# Patient Record
Sex: Female | Born: 1980
Health system: Southern US, Community
[De-identification: ages and names within clinical notes are randomized; demographics above are authoritative.]

## PROBLEM LIST (undated history)

## (undated) DIAGNOSIS — E785 Hyperlipidemia, unspecified: Secondary | ICD-10-CM

## (undated) DIAGNOSIS — Z8632 Personal history of gestational diabetes: Secondary | ICD-10-CM

## (undated) DIAGNOSIS — E119 Type 2 diabetes mellitus without complications: Secondary | ICD-10-CM

## (undated) HISTORY — DX: Hyperlipidemia, unspecified: E78.5

## (undated) HISTORY — PX: BREAST BIOPSY: SHX20

## (undated) HISTORY — PX: TONSILLECTOMY: SUR1361

## (undated) HISTORY — DX: Type 2 diabetes mellitus without complications: E11.9

## (undated) HISTORY — DX: Personal history of gestational diabetes: Z86.32

---

## 1999-02-10 ENCOUNTER — Other Ambulatory Visit: Admission: RE | Admit: 1999-02-10 | Discharge: 1999-02-10 | Payer: Self-pay | Admitting: Obstetrics & Gynecology

## 2002-01-07 ENCOUNTER — Other Ambulatory Visit: Admission: RE | Admit: 2002-01-07 | Discharge: 2002-01-07 | Payer: Self-pay | Admitting: Obstetrics & Gynecology

## 2003-07-14 ENCOUNTER — Other Ambulatory Visit: Admission: RE | Admit: 2003-07-14 | Discharge: 2003-07-14 | Payer: Self-pay | Admitting: Obstetrics & Gynecology

## 2004-11-13 ENCOUNTER — Other Ambulatory Visit: Admission: RE | Admit: 2004-11-13 | Discharge: 2004-11-13 | Payer: Self-pay | Admitting: Obstetrics & Gynecology

## 2005-04-11 ENCOUNTER — Ambulatory Visit: Payer: Self-pay | Admitting: Obstetrics and Gynecology

## 2005-07-05 ENCOUNTER — Observation Stay: Payer: Self-pay | Admitting: Obstetrics and Gynecology

## 2005-07-07 ENCOUNTER — Observation Stay: Payer: Self-pay | Admitting: Obstetrics and Gynecology

## 2005-07-08 ENCOUNTER — Inpatient Hospital Stay: Payer: Self-pay | Admitting: Obstetrics and Gynecology

## 2008-02-21 ENCOUNTER — Other Ambulatory Visit: Payer: Self-pay

## 2008-02-21 ENCOUNTER — Emergency Department: Payer: Self-pay | Admitting: Emergency Medicine

## 2010-06-08 ENCOUNTER — Inpatient Hospital Stay: Payer: Self-pay

## 2011-07-26 ENCOUNTER — Ambulatory Visit: Payer: Self-pay | Admitting: Obstetrics and Gynecology

## 2011-07-30 ENCOUNTER — Observation Stay: Payer: Self-pay

## 2011-08-09 ENCOUNTER — Inpatient Hospital Stay: Payer: Self-pay

## 2012-11-19 ENCOUNTER — Emergency Department: Payer: Self-pay

## 2012-11-19 LAB — LIPASE, BLOOD: Lipase: 273 U/L (ref 73–393)

## 2012-11-19 LAB — URINALYSIS, COMPLETE
Bilirubin,UR: NEGATIVE
Ketone: NEGATIVE
Nitrite: NEGATIVE
Protein: 100
RBC,UR: 569 /HPF (ref 0–5)
Specific Gravity: 1.026 (ref 1.003–1.030)
Squamous Epithelial: 12

## 2012-11-19 LAB — COMPREHENSIVE METABOLIC PANEL
Albumin: 4 g/dL (ref 3.4–5.0)
Anion Gap: 9 (ref 7–16)
BUN: 10 mg/dL (ref 7–18)
Bilirubin,Total: 0.3 mg/dL (ref 0.2–1.0)
Calcium, Total: 9.1 mg/dL (ref 8.5–10.1)
Creatinine: 0.8 mg/dL (ref 0.60–1.30)
EGFR (Non-African Amer.): >60
SGOT(AST): 18 U/L (ref 15–37)
SGPT (ALT): 17 U/L (ref 12–78)
Sodium: 138 mmol/L (ref 136–145)

## 2012-11-19 LAB — CBC
HGB: 13.5 g/dL (ref 12.0–16.0)
RDW: 14.2 % (ref 11.5–14.5)
WBC: 9.8 10*3/uL (ref 3.6–11.0)

## 2012-11-19 LAB — PREGNANCY, URINE: Pregnancy Test, Urine: NEGATIVE m[IU]/mL

## 2013-05-16 ENCOUNTER — Observation Stay: Payer: Self-pay | Admitting: Obstetrics and Gynecology

## 2013-05-16 LAB — URINALYSIS, COMPLETE
Bilirubin,UR: NEGATIVE
Glucose,UR: NEGATIVE mg/dL (ref 0–75)
Ketone: NEGATIVE
Ph: 6 (ref 4.5–8.0)
Specific Gravity: 1.001 (ref 1.003–1.030)
Squamous Epithelial: 2
WBC UR: <1 /HPF (ref 0–5)

## 2013-07-21 ENCOUNTER — Ambulatory Visit: Payer: Self-pay | Admitting: Unknown Physician Specialty

## 2013-08-08 ENCOUNTER — Ambulatory Visit: Payer: Self-pay | Admitting: Unknown Physician Specialty

## 2013-09-04 ENCOUNTER — Inpatient Hospital Stay: Payer: Self-pay | Admitting: Obstetrics and Gynecology

## 2013-09-04 LAB — CBC WITH DIFFERENTIAL/PLATELET
Basophil #: 0 10*3/uL (ref 0.0–0.1)
Eosinophil #: 0.1 10*3/uL (ref 0.0–0.7)
Lymphocyte #: 1.8 10*3/uL (ref 1.0–3.6)
Lymphocyte %: 20.8 %
MCH: 27.5 pg (ref 26.0–34.0)
MCHC: 33.4 g/dL (ref 32.0–36.0)
Monocyte #: 0.6 x10 3/mm (ref 0.2–0.9)
Monocyte %: 7.3 %
Neutrophil #: 6.2 10*3/uL (ref 1.4–6.5)
Neutrophil %: 70.9 %
Platelet: 210 10*3/uL (ref 150–440)
RBC: 4.53 10*6/uL (ref 3.80–5.20)
RDW: 15.2 % — ABNORMAL HIGH (ref 11.5–14.5)

## 2013-09-04 LAB — GLUCOSE, RANDOM: Glucose: 115 mg/dL — ABNORMAL HIGH (ref 65–99)

## 2013-09-05 LAB — HEMATOCRIT: HCT: 34.1 % — ABNORMAL LOW (ref 35.0–47.0)

## 2014-11-11 ENCOUNTER — Ambulatory Visit (INDEPENDENT_AMBULATORY_CARE_PROVIDER_SITE_OTHER): Payer: BLUE CROSS/BLUE SHIELD | Admitting: Podiatry

## 2014-11-11 ENCOUNTER — Encounter: Payer: Self-pay | Admitting: Podiatry

## 2014-11-11 ENCOUNTER — Ambulatory Visit (INDEPENDENT_AMBULATORY_CARE_PROVIDER_SITE_OTHER): Payer: BLUE CROSS/BLUE SHIELD

## 2014-11-11 VITALS — BP 125/85 | HR 94 | Resp 18

## 2014-11-11 DIAGNOSIS — R52 Pain, unspecified: Secondary | ICD-10-CM

## 2014-11-11 DIAGNOSIS — S90122A Contusion of left lesser toe(s) without damage to nail, initial encounter: Secondary | ICD-10-CM

## 2014-11-11 DIAGNOSIS — B351 Tinea unguium: Secondary | ICD-10-CM

## 2014-11-11 NOTE — Progress Notes (Signed)
   Subjective:    Patient ID: Jodi Salazar, female    DOB: 07/31/1981, 34 y.o.   MRN: [id number]  HPI  34 year old female presents the office today with complaints of thick, painful, elongated toenails of the left big toe. She states this been ongoing for several years. She states that she's had no prior treatment and she is requesting the toenail to be removed. She denies any recent redness or drainage on the nail site. She also states that she fell last week injuring her fourth toe for which she has had some bruising. She states this is not the reason for the appointment however she wanted to bring into our attention. She states that after the fall she had some discomfort to the fourth toe on the left foot however over the last couple days she has not had any discomfort. No other complaints at this time.    Review of Systems  All other systems reviewed and are negative.      Objective:   Physical Exam AAO 3, NAD DP/PT pulses palpable, CRT less than 3 seconds Protective sensation intact with Simms Weinstein monofilament, vibratory sensation intact, Achilles tendon reflex intact. Left hallux nail is significantly hypertrophic, dystrophic, discolored, brittle. The right hallux as well as bilateral fifth digit nails are also hypertrophic, dystrophic, discolored. There is no swelling erythema or drainage from the nail sites. No open lesions or pre-ulcerative lesions are identified. There is mild ecchymosis along the dorsal aspect of the left fourth digit just proximal to the nail border. There is no tenderness palpation overlying the digits and there is no pain with range of motion. There is no amount edema, erythema, increase in warmth. MMT 5/5, ROM WNL No past calf compression, swelling, warmth, erythema.       Assessment & Plan:  34 year old female with symptomatic left hallux onychomycosis, onychomycosis bilateral hallux/fifth digit nails; left fourth digit contusion -Treatment options  both conservative and surgical were discussed with the patient going alternatives, risks, complications. -At this time the patient is requesting removal of the left hallux toenail. Risks and applications of this were discussed for which she understands and verbally consents. Under sterile conditions a total of 2.5 mL of a mixture of 2% lidocaine plain and 0.5% Marcaine plain was infiltrated in a hallux block fashion to the left side after the site was properly identified. Once anesthetized the skin was then prepped in sterile fashion. A tourniquet was then applied. Next the left hallux nail was then removed in total with surgery remove all within the nail borders. Once the nail was removed underlying skin was debrided and was found to be intact. There is no purulence or clinical signs of infection. The area was irrigated. Silvadene was applied followed by dry sterile dressing. After application of the dressing the tourniquet was removed and there was found to be an immediate capillary refill time. Patient tolerated the procedure well any complications. Post procedure instructions were discussed the patient for which she verbally understood. [facility] onych[facility]osis. Discussed. Treatment options for onychomycosis however we'll await the results of the biopsy before proceeding treatment. -In regards the left fourth toe recommended buddy splinting. Continue to monitor. -Follow in 1 week for nail check or sooner should any problems are to arise. In the meantime, encouraged to call the office with any questions, concerns, change in symptoms.

## 2014-11-11 NOTE — Patient Instructions (Signed)

## 2014-11-15 ENCOUNTER — Encounter: Payer: Self-pay | Admitting: Podiatry

## 2014-11-16 ENCOUNTER — Telehealth: Payer: Self-pay | Admitting: *Deleted

## 2014-11-16 NOTE — Telephone Encounter (Signed)
Error. Jodi Salazar 

## 2014-11-23 ENCOUNTER — Ambulatory Visit: Payer: BLUE CROSS/BLUE SHIELD | Admitting: Podiatry

## 2014-11-25 ENCOUNTER — Ambulatory Visit (INDEPENDENT_AMBULATORY_CARE_PROVIDER_SITE_OTHER): Payer: BLUE CROSS/BLUE SHIELD | Admitting: Podiatry

## 2014-11-25 ENCOUNTER — Encounter: Payer: Self-pay | Admitting: Podiatry

## 2014-11-25 VITALS — BP 141/116 | HR 91 | Resp 18

## 2014-11-25 DIAGNOSIS — B351 Tinea unguium: Secondary | ICD-10-CM

## 2014-11-25 DIAGNOSIS — R52 Pain, unspecified: Secondary | ICD-10-CM

## 2014-11-25 NOTE — Patient Instructions (Signed)

## 2014-11-28 NOTE — Progress Notes (Signed)
Patient ID: Jodi Salazar, female   DOB: 08/07/1981, 34 y.o.   MRN: [id number]  Subjective: 34 year old phenol returns the office today for follow-up evaluation status post left hallux nail excision. She states that she has some tenderness and soreness to the area at times particularly the pressure however she states it is improving. She denies any drainage or purulence from around the area. She denies any surrounding erythema or any red streaking. Denies any systemic complaints as fevers, chills, nausea, vomiting. No other complaints at this time in no acute changes since last appointment.  Objective: AAO 3, NAD DP/PT pulses palpable, CRT less than 3 seconds Protective sensation intact with Simms Weinstein monofilament, vibratory sensation intact, Achilles tendon reflex intact. Left hallux status post total nail avulsion which is healing well but this timeframe. There is no purulence or drainage identified. There is no swelling erythema or ascending cellulitis. No areas of partial or crepitus. No malodor. There is mild tenderness to palpation of the procedure site. No other areas of tenderness to palpation of bilateral lower extremity's. No other open lesions or pre-ulcerative lesions are identified. No pain with calf compression, swelling, warmth, erythema.  Assessment: 34 year old female 1 week status post left hallux toenail removal  Plan -Treatment options discussed including alternatives, risks, complications. -At this time recommended to continue soaking in Epson salt soaks twice a day covering with antibiotic ointment and a Band-Aid. Can leave the area uncovered at night. Continue this until the area has completely healed. Monitor for any signs or symptoms of infection and directed to call the office immediately should any occur or go to the ER. -Will call the patient once the results of the biopsy are obtained. -Follow-up in 2 weeks if the area has not completely healed or symptomatic.  Follow-up sooner if there are any changes. In the meantime, course and call the office with any questions, concerns, change in symptoms.

## 2014-12-03 ENCOUNTER — Encounter: Payer: Self-pay | Admitting: Podiatry

## 2014-12-07 ENCOUNTER — Ambulatory Visit (INDEPENDENT_AMBULATORY_CARE_PROVIDER_SITE_OTHER): Payer: BLUE CROSS/BLUE SHIELD | Admitting: Podiatry

## 2014-12-07 DIAGNOSIS — B351 Tinea unguium: Secondary | ICD-10-CM

## 2014-12-07 MED ORDER — TAVABOROLE 5 % EX SOLN: 1.0000 [drp] | CUTANEOUS | Status: DC

## 2014-12-07 NOTE — Progress Notes (Signed)
Patient ID: Jodi Salazar, female   DOB: 12/04/1980, 34 y.o.   MRN: [id number]  Subjective: Patient returns to the office today to discuss lab results. Since last appointment, she states that she has had no problems with the left hallux s/p nail removal. Denies any redness/driange/pain. No other complaints at this time.   Objective: AAO x3, NAD Neurovascular status unchanged Left hallux nail procedure site is healed with a small scab overlying the area. There is no tenderness to palpation of the area, drainage/purulence, erythema, malodor, or other clinical signs of infection.  Nails are dystrophic, discolored, hypertrophic. There is no erythema or drainage around the remaining nails. No tenderness along the nail sites.  No areas of tenderness to bilateral lower extremities. No overlying edema, erythema increase in warmth.  No pain with calf compression, swelling, warmth, erythema No open lesions/pre-ulcerative lesions bilaterally.   Assessment: 34 year old female presents to discuss nail biopsy results  Plan: Nail biopsy results were discussed with the patient which revealed onychomycosis. Discussed with her various treatment options. I recommended lamisil based on the culture. She has elected to proceed with topical treatment due to potential side effects. A prescription for kerydin was sent to the pharmacy and she was given contact information to follow-up with them. Discussed side effects of the mediation and how to apply the medication. Follow-up as needed or sooner if any problems are to arise.

## 2015-02-15 NOTE — H&P (Signed)
L&D Evaluation:  History:  HPI 34 year old G4 P3003 with EDC=09/25/2013 by a 5wk 6 day ultrasound at presents at 37 weeks with onset of labor at 0600 this AM followed by bloody show at 0630. [facility]. Her prenatal course remarkable for GDM controlled on diet however last EFW on  11/3 was 6# 10 oz (3 1/2 weeks ago). PNC also remarkable for depression (was on 100 mgm Zoloft daily at one pt in her pregnancy), RH negative (received Rhogam at 28 weeks), and obesity (BMI>34 at start of care). Received TDAP on 07/20/13. LABS: A neg, RI, VI, GBS negative. Will be bottle feeding.   Presents with contractions, vaginal bleeding   Patient's Medical History GDM, depression   Patient's Surgical History Tonsilectomy   Medications Pre Natal Vitamins   Allergies NKDA   Social History none   Family History Non-Contributory   ROS:  ROS see HPI   Exam:  Vital Signs stable   Urine Protein not completed   General breathing with contractions   Mental Status clear   Chest clear   Heart normal sinus rhythm, no murmur/gallop/rubs   Abdomen gravid, tender with contractions   Estimated Fetal Weight Average for gestational age, 8#   Fetal Position cephalic   Edema trace edema, V V lower extremities   Pelvic 7/80%/0 per RN exam   Mebranes Intact   FHT normal rate with no decels, 135 with accels to 150   FHT Description Cat 1   Ucx regular, q3-4 min   Skin dry   Impression:  Impression IUP at 37 weeks in active labor. GDM controlled on diet.   Plan:  Plan EFM/NST, monitor contractions and for cervical change, Expectant management for now. Blood sugar with admission labs was 115.   Electronic Signatures: Trinna BalloonGutierrez, Aja Bolander L (CNM)  (Signed [phone]-Nov-14 12:52)  Authored: L&D Evaluation   Last Updated: 28-Nov-14 12:52 by Trinna BalloonGutierrez, Sera Hitsman L (CNM)

## 2016-02-08 DIAGNOSIS — E1169 Type 2 diabetes mellitus with other specified complication: Secondary | ICD-10-CM | POA: Insufficient documentation

## 2016-02-08 DIAGNOSIS — E7800 Pure hypercholesterolemia, unspecified: Secondary | ICD-10-CM | POA: Insufficient documentation

## 2016-02-08 DIAGNOSIS — E785 Hyperlipidemia, unspecified: Secondary | ICD-10-CM | POA: Insufficient documentation

## 2016-02-23 ENCOUNTER — Encounter: Payer: Self-pay | Admitting: *Deleted

## 2016-02-23 ENCOUNTER — Encounter: Payer: BLUE CROSS/BLUE SHIELD | Attending: Family Medicine | Admitting: *Deleted

## 2016-02-23 VITALS — BP 104/70 | Ht 70.0 in | Wt 231.0 lb

## 2016-02-23 DIAGNOSIS — E119 Type 2 diabetes mellitus without complications: Secondary | ICD-10-CM | POA: Insufficient documentation

## 2016-02-23 NOTE — Progress Notes (Signed)
Diabetes Self-Management Education  Visit Type: First/Initial  Appt. Start Time: 1340 Appt. End Time: 1435  02/23/2016  [name], identified by name and date of birth, is a 35 y.o. female with a diagnosis of Diabetes: Type 2.   ASSESSMENT  Blood pressure 104/70, height 5\' 10"  (1.778 m), weight 231 lb (104.781 kg), last menstrual period 02/12/2016. Body mass index is 33.15 kg/(m^2).      Diabetes Self-Management Education - 02/23/16 1456    Visit Information   Visit Type First/Initial   Initial Visit   Diabetes Type Type 2   Are you currently following a meal plan? Yes   What type of meal plan do you follow? "cut carbs out and increase water intake" also stopped drinking sugar sweetened tea   Are you taking your medications as prescribed? Yes   Date Diagnosed Pt reports 02/08/16; A1C indicative of diabetes on 11/17/15   Health Coping   How would you rate your overall health? Good   Psychosocial Assessment   Patient Belief/Attitude about Diabetes Motivated to manage diabetes   Self-care barriers None   Self-management support Doctor's office;Family   Patient Concerns Nutrition/Meal planning;Medication;Glycemic Control;Weight Control;Healthy Lifestyle;Problem Solving   Special Needs None   Preferred Learning Style Auditory;Visual;Hands on   Learning Readiness Change in progress   How often do you need to have someone help you when you read instructions, pamphlets, or other written materials from your doctor or pharmacy? 1 - Never   What is the last grade level you completed in school? some college   Pre-Education Assessment   Patient understands the diabetes disease and treatment process. Needs Review   Patient understands incorporating nutritional management into lifestyle. Needs Instruction   Patient undertands incorporating physical activity into lifestyle. Needs Review   Patient understands using medications safely. Needs Instruction   Patient understands monitoring  blood glucose, interpreting and using results Needs Review   Patient understands prevention, detection, and treatment of acute complications. Needs Instruction   Patient understands prevention, detection, and treatment of chronic complications. Needs Review   Patient understands how to develop strategies to address psychosocial issues. Needs Instruction   Patient understands how to develop strategies to promote health/change behavior. Needs Review   Complications   Last HgB A1C per patient/outside source 8.9 %  11/17/15   How often do you check your blood sugar? 1-2 times/day   Fasting Blood glucose range (mg/dL) [phone]  Pt reports FBG's range from 140-145 mg/dL.    Have you had a dilated eye exam in the past 12 months? Yes   Have you had a dental exam in the past 12 months? Yes   Are you checking your feet? Yes   How many days per week are you checking your feet? 7   Dietary Intake   Breakfast eggs and 2 strips of bacon; yogurt with 1/2 banana    Snack (morning) cheese stick and yogurt   Lunch tuna, crackers, fruit , vegetables, salada   Snack (afternoon) graham crackers with peanut butter   Dinner chicken, vegetables   Beverage(s) water   Exercise   Exercise Type Light (walking / raking leaves)   How many days per week to you exercise? 5   How many minutes per day do you exercise? 60   Total minutes per week of exercise 300   Patient Education   Previous Diabetes Education Yes (please comment)  Pt had Gestational Diabetes and received education 10 years ago and maybe with her last child  that was born 3 years ago.   Disease state  Definition of diabetes, type 1 and 2, and the diagnosis of diabetes;Factors that contribute to the development of diabetes   Nutrition management  Role of diet in the treatment of diabetes and the relationship between the three main macronutrients and blood glucose level;Carbohydrate counting   Physical activity and exercise  Role of exercise on diabetes  management, blood pressure control and cardiac health.   Medications Reviewed patients medication for diabetes, action, purpose, timing of dose and side effects.   Monitoring Purpose and frequency of SMBG.;Identified appropriate SMBG and/or A1C goals.   Chronic complications Relationship between chronic complications and blood glucose control   Psychosocial adjustment Identified and addressed patients feelings and concerns about diabetes   Individualized Goals (developed by patient)   Reducing Risk Improve blood sugars Decrease medications Prevent diabetes complications Lose weight Lead a healthier lifestyle Become more fit   Outcomes   Expected Outcomes Demonstrated interest in learning. Expect positive outcomes      Individualized Plan for Diabetes Self-Management Training:   Learning Objective:  Patient will have a greater understanding of diabetes self-management. Patient education plan is to attend individual and/or group sessions per assessed needs and concerns.   Plan:   Patient Instructions  Check blood sugars 1 x day before breakfast or 2 hrs after supper every day Exercise: Continue walking for 60  minutes  4-5 days a week Eat 3 meals day,  1-2  snacks a day Space meals 4-6 hours apart Allow 2-3 hours between meals and snacks Bring blood sugar records to the next class   Expected Outcomes:  Demonstrated interest in learning. Expect positive outcomes  Education material provided:  General Meal Planning Guidelines Simple Meal Plan  If problems or questions, patient to contact team via:  Sharion Settler, RN, CCM, CDE [phone]  Future DSME appointment:  Thursday Mar 01, 2016 for Diabetes Class 1

## 2016-02-23 NOTE — Patient Instructions (Addendum)
Check blood sugars 1 x day before breakfast or 2 hrs after supper every day Exercise: Continue walking for 60  minutes  4-5 days a week Eat 3 meals day,  1-2  snacks a day Space meals 4-6 hours apart Allow 2-3 hours between meals and snacks Bring blood sugar records to the next class

## 2016-03-01 ENCOUNTER — Encounter: Payer: Self-pay | Admitting: Dietician

## 2016-03-01 ENCOUNTER — Encounter: Payer: BLUE CROSS/BLUE SHIELD | Admitting: Dietician

## 2016-03-01 VITALS — Wt 229.6 lb

## 2016-03-01 DIAGNOSIS — E119 Type 2 diabetes mellitus without complications: Secondary | ICD-10-CM

## 2016-03-01 NOTE — Progress Notes (Signed)

## 2016-03-08 ENCOUNTER — Encounter: Payer: BLUE CROSS/BLUE SHIELD | Attending: Family Medicine | Admitting: *Deleted

## 2016-03-08 ENCOUNTER — Encounter: Payer: Self-pay | Admitting: *Deleted

## 2016-03-08 VITALS — Wt 227.0 lb

## 2016-03-08 DIAGNOSIS — E119 Type 2 diabetes mellitus without complications: Secondary | ICD-10-CM | POA: Insufficient documentation

## 2016-03-08 NOTE — Progress Notes (Signed)
Appt. Start Time: 0900 Appt. End Time: 1030  Pt had to leave to go to work and only stayed for 1/2 class.   Class 2 Nutritional Management - describe effects of food on blood glucose; identify sources of carbohydrate, protein and fat; verbalize the importance of balance meals in controlling blood glucose; identify meals as well balanced or not; estimate servings of carbohydrate from menus; use food labels to identify servings size, content of carbohydrate, fiber, protein, fat, saturated fat and sodium; recognize food sources of fat, saturated fat, trans fat, sodium and verbalize goals for intake; describe healthful appropriate food choices when dining out   Self-Monitoring - state importance of HBGM and demo procedure accurately; use HBGM results to effectively manage diabetes; identify importance of regular HbA1C testing and goals for results  Teaching Materials Used: Class 2 Slide Packet Menu Ideas Goals for Class 2

## 2016-03-29 ENCOUNTER — Encounter: Payer: Self-pay | Admitting: *Deleted

## 2016-03-29 ENCOUNTER — Encounter: Payer: BLUE CROSS/BLUE SHIELD | Admitting: *Deleted

## 2016-03-29 VITALS — Wt 226.4 lb

## 2016-03-29 DIAGNOSIS — E119 Type 2 diabetes mellitus without complications: Secondary | ICD-10-CM

## 2016-03-29 NOTE — Progress Notes (Signed)
Appt. Start Time: 0900 Appt. End Time: 1200 (pt had already attended 1st part of this class on 03/08/16)  Class 2 Diabetes Overview - define DM; state own type of DM; identify functions of pancreas and insulin; define insulin deficiency vs insulin resistance  Psychosocial - identify DM as a source of stress; state the effects of stress on BG control; verbalize appropriate stress management techniques; identify personal stress issues   Nutritional Management - describe effects of food on blood glucose; identify sources of carbohydrate, protein and fat; verbalize the importance of balance meals in controlling blood glucose; identify meals as well balanced or not; estimate servings of carbohydrate from menus; use food labels to identify servings size, content of carbohydrate, fiber, protein, fat, saturated fat and sodium; recognize food sources of fat, saturated fat, trans fat, sodium and verbalize goals for intake; describe healthful appropriate food choices when dining out   Exercise - describe the effects of exercise on blood glucose and importance of regular exercise in controlling diabetes; state a plan for personal exercise; verbalize contraindications for exercise  Medications - state name, dose, timing of currently prescribed medications; describe types of medications available for diabetes  Self-Monitoring - state importance of HBGM and demo procedure accurately; use HBGM results to effectively manage diabetes; identify importance of regular HbA1C testing and goals for results  Acute Complications/Sick Day Guidelines - recognize hyperglycemia and hypoglycemia with causes and effects; identify blood glucose results as high, low or in control; list steps in treating and preventing high and low blood glucose; state appropriate measure to manage blood glucose when ill (need for meds, HBGM plan, when to call physician, need for fluids)  Chronic Complications/Foot, Skin, Eye Dental Care - identify  possible long-term complications of diabetes (retinopathy, neuropathy, nephropathy, cardiovascular disease, infections); explain steps in prevention and treatment of chronic complications; state importance of daily self-foot exams; describe how to examine feet and what to look for; explain appropriate eye and dental care  Lifestyle Changes/Goals & Health/Community Resources - state benefits of making appropriate lifestyle changes; identify habits that need to change (meals, tobacco, alcohol); identify strategies to reduce risk factors for personal health; set goals for proper diabetes care; state need for and frequency of healthcare follow-up; describe appropriate community resources for good health (ADA, web sites, apps)   Pregnancy/Sexual Health - define gestational diabetes; state importance of good blood glucose control and birth control prior to pregnancy; state importance of good blood glucose control in preventing sexual problems (impotence, vaginal dryness, infections, loss of desire); state relationship of blood glucose control and pregnancy outcome; describe risk of maternal and fetal complications  Teaching Materials Used: Class 2 Slide Packet A1C Pamphlet Foot Care Literature Menu Ideas Goals for Class 2

## 2016-04-05 ENCOUNTER — Encounter: Payer: Self-pay | Admitting: Dietician

## 2016-04-05 ENCOUNTER — Encounter: Payer: BLUE CROSS/BLUE SHIELD | Admitting: Dietician

## 2016-04-05 VITALS — BP 120/84 | Ht 70.0 in | Wt 222.4 lb

## 2016-04-05 DIAGNOSIS — E119 Type 2 diabetes mellitus without complications: Secondary | ICD-10-CM | POA: Diagnosis not present

## 2016-04-05 NOTE — Progress Notes (Signed)

## 2016-04-13 ENCOUNTER — Encounter: Payer: Self-pay | Admitting: *Deleted

## 2017-01-24 DIAGNOSIS — E7800 Pure hypercholesterolemia, unspecified: Secondary | ICD-10-CM | POA: Diagnosis not present

## 2017-01-24 DIAGNOSIS — E119 Type 2 diabetes mellitus without complications: Secondary | ICD-10-CM | POA: Diagnosis not present

## 2017-01-28 DIAGNOSIS — E119 Type 2 diabetes mellitus without complications: Secondary | ICD-10-CM | POA: Diagnosis not present

## 2017-01-28 DIAGNOSIS — E7800 Pure hypercholesterolemia, unspecified: Secondary | ICD-10-CM | POA: Diagnosis not present

## 2017-01-28 DIAGNOSIS — Z23 Encounter for immunization: Secondary | ICD-10-CM | POA: Diagnosis not present

## 2017-01-28 DIAGNOSIS — E6609 Other obesity due to excess calories: Secondary | ICD-10-CM | POA: Diagnosis not present

## 2017-01-28 DIAGNOSIS — Z0000 Encounter for general adult medical examination without abnormal findings: Secondary | ICD-10-CM | POA: Diagnosis not present

## 2017-01-30 ENCOUNTER — Ambulatory Visit (INDEPENDENT_AMBULATORY_CARE_PROVIDER_SITE_OTHER): Payer: BLUE CROSS/BLUE SHIELD | Admitting: Obstetrics and Gynecology

## 2017-01-30 ENCOUNTER — Encounter: Payer: Self-pay | Admitting: Obstetrics and Gynecology

## 2017-01-30 DIAGNOSIS — Z124 Encounter for screening for malignant neoplasm of cervix: Secondary | ICD-10-CM | POA: Diagnosis not present

## 2017-01-30 DIAGNOSIS — Z01419 Encounter for gynecological examination (general) (routine) without abnormal findings: Secondary | ICD-10-CM

## 2017-01-30 MED ORDER — NORGESTIMATE-ETH ESTRADIOL 0.25-35 MG-MCG PO TABS: 1.0000 | ORAL_TABLET | Freq: Every day | ORAL | 3 refills | Status: DC

## 2017-01-30 NOTE — Progress Notes (Signed)
Patient ID: Jodi Salazar, female   DOB: 16-Dec-1980, 36 y.o.   MRN: [id number]     Gynecology Annual Exam  PCP: Marisue Ivan, MD  Chief Complaint:  Chief Complaint  Patient presents with  . Gynecologic Exam    Refill ocp    History of Present Illness: Patient is a 36 y.o. G5P0010 presents for annual exam. The patient has no complaints today.   LMP: Patient's last menstrual period was 12/25/2016 (exact date). Average Interval: regular, 28 days Duration of flow: 5 days Heavy Menses: no Clots: no Intermenstrual Bleeding: yes  Last two cycles has noted some spotting no missed pills Postcoital Bleeding: no Dysmenorrhea: no The patient is not sexually active. She currently uses Oral contraceptives: Present: yes for contraception. She denies dyspareunia.  The patient does perform self breast exams.  There is no notable family history of breast or ovarian cancer in her family.  The patient wears seatbelts: no.   The patient has regular exercise: not asked.    The patient denies current symptoms of depression.    Review of Systems: Review of Systems  Constitutional: Negative for chills and fever.  HENT: Negative for congestion.   Respiratory: Negative for cough and shortness of breath.   Cardiovascular: Negative for chest pain and palpitations.  Gastrointestinal: Negative for abdominal pain, constipation, diarrhea, heartburn, nausea and vomiting.  Genitourinary: Negative for dysuria, frequency and urgency.  Skin: Negative for itching and rash.  Neurological: Negative for dizziness and headaches.  Endo/Heme/Allergies: Negative for polydipsia.  Psychiatric/Behavioral: Negative for depression.    Past Medical History:  Past Medical History:  Diagnosis Date  . Diabetes mellitus without complication (HCC)   . History of gestational diabetes   . Hyperlipidemia     Past Surgical History:  Past Surgical History:  Procedure Laterality Date  . TONSILLECTOMY      Gynecologic  History:  Patient's last menstrual period was 12/25/2016 (exact date). Contraception: OCP (estrogen/progesterone) Last Pap: Results were: NIL and HR HPV negative 11/10/2015  Obstetric History: [id number]  Family History:  Family History  Problem Relation Age of Onset  . Bladder Cancer Maternal Grandfather   . Lung cancer Paternal Grandmother   . Lung cancer Paternal Grandfather     Social History:  Social History   Social History  . Marital status: Married    Spouse name: N/A  . Number of children: N/A  . Years of education: N/A   Occupational History  . Not on file.   Social History Main Topics  . Smoking status: Never Smoker  . Smokeless tobacco: Never Used  . Alcohol use 0.0 oz/week     Comment: occasional  . Drug use: No  . Sexual activity: Yes    Birth control/ protection: Pill   Other Topics Concern  . Not on file   Social History Narrative  . No narrative on file    Allergies:  No Known Allergies  Medications: Prior to Admission medications   Medication Sig Start Date End Date Taking? Authorizing Provider  glucose blood (ONE TOUCH ULTRA TEST) test strip Use as directed. Check CBG's fasting once daily. Dx: E11.9 11/30/16 11/30/17 Yes Historical Provider, MD  Lancets 30G MISC Use 1 each as directed. Check CBG's fasting once daily. Dx: E11.9 11/30/16  Yes Historical Provider, MD  lisinopril (PRINIVIL,ZESTRIL) 2.5 MG tablet Take by mouth. 01/28/17 01/28/18 Yes Historical Provider, MD  aspirin EC 81 MG tablet Take 81 mg by mouth daily.    Historical Provider, MD  lovastatin (MEVACOR)  20 MG tablet Take 20 mg by mouth daily after supper. 02/08/16 02/07/17  Historical Provider, MD  metFORMIN (GLUCOPHAGE) 500 MG tablet Take 500 mg by mouth 2 (two) times daily. 02/08/16   Historical Provider, MD  norgestimate-ethinyl estradiol (ORTHO-CYCLEN,SPRINTEC,PREVIFEM) 0.25-35 MG-MCG tablet Take 1 tablet by mouth daily.    Historical Provider, MD    Physical Exam Vitals: Blood  pressure 114/72, pulse 67, height  (1.778 m), weight 217 lb (98.4 kg), last menstrual period 12/25/2016.  General: NAD HEENT: normocephalic, anicteric Thyroid: no enlargement, no palpable nodules Pulmonary: No increased work of breathing, CTAB Cardiovascular: RRR, distal pulses 2+ Breast: Breast symmetrical, no tenderness, no palpable nodules or masses, no skin or nipple retraction present, no nipple discharge.  No axillary or supraclavicular lymphadenopathy. Abdomen: NABS, soft, non-tender, non-distended.  Umbilicus without lesions.  No hepatomegaly, splenomegaly or masses palpable. No evidence of hernia  Genitourinary:  External: Normal external female genitalia.  Normal urethral meatus, normal  Bartholin's and Skene's glands.    Vagina: Normal vaginal mucosa, no evidence of prolapse.    Cervix: Grossly normal in appearance, no bleeding  Uterus: Non-enlarged, mobile, normal contour.  No CMT  Adnexa: ovaries non-enlarged, no adnexal masses  Rectal: deferred  Lymphatic: no evidence of inguinal lymphadenopathy Extremities: no edema, erythema, or tenderness Neurologic: Grossly intact Psychiatric: mood appropriate, affect full  Female chaperone present for pelvic and breast  portions of the physical exam    Assessment: 36 y.o. [id number] ROUTINE ANNUAL  Plan: Problem List Items Addressed This Visit    None    Visit Diagnoses    Screening for malignant neoplasm of cervix       Relevant Orders   PapIG, HPV, rfx 16/18   Encounter for gynecological examination without abnormal finding       Relevant Orders   PapIG, HPV, rfx 16/18      1) STI screening was offered and declined  2) ASCCP guidelines and rational discussed.  Patient opts for yearly screening interval  3) Contraception - Education given regarding options for contraception, including oral contraceptives.  If continued spotting discussed obtaining TVUS to evaluate endometrium  4) Routine healthcare maintenance  including cholesterol, diabetes screening discussed managed by PCP  5) Follow up 1 year for routine annual exam

## 2017-01-30 NOTE — Patient Instructions (Signed)
Preventive Care 18-39 Years, Female Preventive care refers to lifestyle choices and visits with your health care provider that can promote health and wellness. What does preventive care include?  A yearly physical exam. This is also called an annual well check.  Dental exams once or twice a year.  Routine eye exams. Ask your health care provider how often you should have your eyes checked.  Personal lifestyle choices, including:  Daily care of your teeth and gums.  Regular physical activity.  Eating a healthy diet.  Avoiding tobacco and drug use.  Limiting alcohol use.  Practicing safe sex.  Taking vitamin and mineral supplements as recommended by your health care provider. What happens during an annual well check? The services and screenings done by your health care provider during your annual well check will depend on your age, overall health, lifestyle risk factors, and family history of disease. Counseling  Your health care provider may ask you questions about your:  Alcohol use.  Tobacco use.  Drug use.  Emotional well-being.  Home and relationship well-being.  Sexual activity.  Eating habits.  Work and work environment.  Method of birth control.  Menstrual cycle.  Pregnancy history. Screening  You may have the following tests or measurements:  Height, weight, and BMI.  Diabetes screening. This is done by checking your blood sugar (glucose) after you have not eaten for a while (fasting).  Blood pressure.  Lipid and cholesterol levels. These may be checked every 5 years starting at age 20.  Skin check.  Hepatitis C blood test.  Hepatitis B blood test.  Sexually transmitted disease (STD) testing.  BRCA-related cancer screening. This may be done if you have a family history of breast, ovarian, tubal, or peritoneal cancers.  Pelvic exam and Pap test. This may be done every 3 years starting at age 21. Starting at age 30, this may be done every 5  years if you have a Pap test in combination with an HPV test. Discuss your test results, treatment options, and if necessary, the need for more tests with your health care provider. Vaccines  Your health care provider may recommend certain vaccines, such as:  Influenza vaccine. This is recommended every year.  Tetanus, diphtheria, and acellular pertussis (Tdap, Td) vaccine. You may need a Td booster every 10 years.  Varicella vaccine. You may need this if you have not been vaccinated.  HPV vaccine. If you are 26 or younger, you may need three doses over 6 months.  Measles, mumps, and rubella (MMR) vaccine. You may need at least one dose of MMR. You may also need a second dose.  Pneumococcal 13-valent conjugate (PCV13) vaccine. You may need this if you have certain conditions and were not previously vaccinated.  Pneumococcal polysaccharide (PPSV23) vaccine. You may need one or two doses if you smoke cigarettes or if you have certain conditions.  Meningococcal vaccine. One dose is recommended if you are age 19-21 years and a first-year college student living in a residence hall, or if you have one of several medical conditions. You may also need additional booster doses.  Hepatitis A vaccine. You may need this if you have certain conditions or if you travel or work in places where you may be exposed to hepatitis A.  Hepatitis B vaccine. You may need this if you have certain conditions or if you travel or work in places where you may be exposed to hepatitis B.  Haemophilus influenzae type b (Hib) vaccine. You may need this   if you have certain risk factors. Talk to your health care provider about which screenings and vaccines you need and how often you need them. This information is not intended to replace advice given to you by your health care provider. Make sure you discuss any questions you have with your health care provider. Document Released: 11/20/2001 Document Revised: 06/13/2016  Document Reviewed: 07/26/2015 Elsevier Interactive Patient Education  2017 [employer].

## 2017-02-01 LAB — PAPIG, HPV, RFX 16/18
HPV, high-risk: NEGATIVE
PAP SMEAR COMMENT: 0

## 2017-11-07 ENCOUNTER — Telehealth: Payer: Self-pay

## 2017-11-07 NOTE — Telephone Encounter (Signed)
Pt has apt for 02/03/18 but needs a refill on her OCP sent to [facility]. ZO#[phone]#[phone]

## 2017-11-08 NOTE — Telephone Encounter (Signed)
LMVM TRC. Verified w/pharmacy pt just p/u last 3 mo rx today. Rx will run out 01/31/18. Pt apt is 02/03/18. Asked patient to contact us to confirm her start date of this last refill & verify if a 1 mo rx is needed prior to her next apt.

## 2018-01-29 ENCOUNTER — Other Ambulatory Visit: Payer: Self-pay

## 2018-01-29 MED ORDER — NORGESTIMATE-ETH ESTRADIOL 0.25-35 MG-MCG PO TABS: 1.0000 | ORAL_TABLET | Freq: Every day | ORAL | 0 refills | Status: DC

## 2018-01-29 NOTE — Telephone Encounter (Signed)
Pt resch AEX to June.  Needs rf of bc sent to [facility]. [phone]  Left msg c female co-worker rx sent in.

## 2018-02-03 ENCOUNTER — Ambulatory Visit: Payer: BLUE CROSS/BLUE SHIELD | Admitting: Obstetrics and Gynecology

## 2018-03-13 ENCOUNTER — Other Ambulatory Visit: Payer: Self-pay

## 2018-03-13 ENCOUNTER — Encounter: Payer: Self-pay | Admitting: Obstetrics and Gynecology

## 2018-03-13 ENCOUNTER — Ambulatory Visit (INDEPENDENT_AMBULATORY_CARE_PROVIDER_SITE_OTHER): Payer: BLUE CROSS/BLUE SHIELD | Admitting: Obstetrics and Gynecology

## 2018-03-13 VITALS — BP 120/76 | HR 97 | Ht 70.0 in | Wt 239.0 lb

## 2018-03-13 DIAGNOSIS — L209 Atopic dermatitis, unspecified: Secondary | ICD-10-CM | POA: Diagnosis not present

## 2018-03-13 DIAGNOSIS — Z01419 Encounter for gynecological examination (general) (routine) without abnormal findings: Secondary | ICD-10-CM | POA: Diagnosis not present

## 2018-03-13 DIAGNOSIS — Z1239 Encounter for other screening for malignant neoplasm of breast: Secondary | ICD-10-CM

## 2018-03-13 DIAGNOSIS — Z124 Encounter for screening for malignant neoplasm of cervix: Secondary | ICD-10-CM

## 2018-03-13 DIAGNOSIS — Z1231 Encounter for screening mammogram for malignant neoplasm of breast: Secondary | ICD-10-CM

## 2018-03-13 DIAGNOSIS — Z0000 Encounter for general adult medical examination without abnormal findings: Secondary | ICD-10-CM

## 2018-03-13 MED ORDER — NORGESTIMATE-ETH ESTRADIOL 0.25-35 MG-MCG PO TABS: 1.0000 | ORAL_TABLET | Freq: Every day | ORAL | 3 refills | Status: DC

## 2018-03-13 MED ORDER — TRIAMCINOLONE ACETONIDE 0.1 % EX CREA: 1.0000 "application " | TOPICAL_CREAM | Freq: Two times a day (BID) | CUTANEOUS | 1 refills | Status: DC

## 2018-03-13 NOTE — Progress Notes (Signed)
Gynecology Annual Exam   PCP: Marisue Ivan, MD  Chief Complaint:  Chief Complaint  Patient presents with  . Gynecologic Exam    No complaints    History of Present Illness: Patient is a 37 y.o. G5P0010 presents for annual exam. The patient has no complaints today.   LMP: Patient's last menstrual period was 02/27/2018. Average Interval: regular, 28 days Duration of flow: 5 days Heavy Menses: no Clots: no Intermenstrual Bleeding: no Postcoital Bleeding: no Dysmenorrhea: no  The patient is sexually active. She currently uses OCP (estrogen/progesterone) for contraception. She denies dyspareunia.  The patient does perform self breast exams.  There is no notable family history of breast or ovarian cancer in her family.  The patient wears seatbelts: yes.   The patient has regular exercise: not asked.    The patient denies current symptoms of depression.    Has poison ivy or poison oak exposure on her hands, continues to have pruritus particularly at night.  Review of Systems: Review of Systems  Constitutional: Negative for chills and fever.  HENT: Negative for congestion.   Respiratory: Negative for cough and shortness of breath.   Cardiovascular: Negative for chest pain and palpitations.  Gastrointestinal: Negative for abdominal pain, constipation, diarrhea, heartburn, nausea and vomiting.  Genitourinary: Negative for dysuria, frequency and urgency.  Skin: Positive for itching and rash.  Neurological: Negative for dizziness and headaches.  Endo/Heme/Allergies: Negative for polydipsia.  Psychiatric/Behavioral: Negative for depression.    Past Medical History:  Past Medical History:  Diagnosis Date  . Diabetes mellitus without complication (HCC)   . History of gestational diabetes   . Hyperlipidemia     Past Surgical History:  Past Surgical History:  Procedure Laterality Date  . TONSILLECTOMY      Gynecologic History:  Patient's last menstrual period was  02/27/2018. Contraception: OCP (estrogen/progesterone) Last Pap: Results were: NIL and HR HPV negative 01/30/2017 Obstetric History: G5P0010  Family History:  Family History  Problem Relation Age of Onset  . Bladder Cancer Maternal Grandfather   . Lung cancer Paternal Grandmother   . Lung cancer Paternal Grandfather   . Breast cancer Paternal Aunt 5    Social History:  Social History   Socioeconomic History  . Marital status: Married    Spouse name: Not on file  . Number of children: Not on file  . Years of education: Not on file  . Highest education level: Not on file  Occupational History  . Not on file  Social Needs  . Financial resource strain: Not on file  . Food insecurity:    Worry: Not on file    Inability: Not on file  . Transportation needs:    Medical: Not on file    Non-medical: Not on file  Tobacco Use  . Smoking status: Never Smoker  . Smokeless tobacco: Never Used  Substance and Sexual Activity  . Alcohol use: Yes    Alcohol/week: 0.0 oz    Comment: occasional  . Drug use: No  . Sexual activity: Yes    Birth control/protection: Pill  Lifestyle  . Physical activity:    Days per week: 4 days    Minutes per session: 60 min  . Stress: Not on file  Relationships  . Social connections:    Talks on phone: Not on file    Gets together: Not on file    Attends religious service: Not on file    Active member of club or organization: Not on file  Attends meetings of clubs or organizations: Not on file    Relationship status: Not on file  . Intimate partner violence:    Fear of current or ex partner: Not on file    Emotionally abused: Not on file    Physically abused: Not on file    Forced sexual activity: Not on file  Other Topics Concern  . Not on file  Social History Narrative  . Not on file    Allergies:  No Known Allergies  Medications: Prior to Admission medications   Medication Sig Start Date End Date Taking? Authorizing Provider    aspirin EC 81 MG tablet Take 81 mg by mouth daily.    [provider]  Lancets 30G MISC Use 1 each as directed. Check CBG's fasting once daily. Dx: E11.9 11/30/16   [provider]  lisinopril (PRINIVIL,ZESTRIL) 2.5 MG tablet Take by mouth. 01/28/17 01/28/18  [provider]  lovastatin (MEVACOR) 20 MG tablet Take 20 mg by mouth daily after supper. 02/08/16 02/07/17  [provider]  metFORMIN (GLUCOPHAGE) 500 MG tablet Take 500 mg by mouth 2 (two) times daily. 02/08/16   [provider]  norgestimate-ethinyl estradiol (ORTHO-CYCLEN,SPRINTEC,PREVIFEM) 0.25-35 MG-MCG tablet Take 1 tablet by mouth daily. 01/29/18   Vena AustriaStaebler, Dajiah Kooi, MD    Physical Exam Vitals: Blood pressure 120/76, pulse 97, height 5\' 10"  (1.778 m), weight 239 lb (108.4 kg), last menstrual period 02/27/2018. Body mass index is 34.29 kg/m.   General: NAD HEENT: normocephalic, anicteric Thyroid: no enlargement, no palpable nodules Pulmonary: No increased work of breathing, CTAB Cardiovascular: RRR, distal pulses 2+ Breast: Breast symmetrical, no tenderness, no palpable nodules or masses, no skin or nipple retraction present, no nipple discharge.  No axillary or supraclavicular lymphadenopathy. Abdomen: NABS, soft, non-tender, non-distended.  Umbilicus without lesions.  No hepatomegaly, splenomegaly or masses palpable. No evidence of hernia  Genitourinary:  External: Normal external female genitalia.  Normal urethral meatus, normal Bartholin's and Skene's glands.    Vagina: Normal vaginal mucosa, no evidence of prolapse.    Cervix: Grossly normal in appearance, no bleeding  Uterus: Non-enlarged, mobile, normal contour.  No CMT  Adnexa: ovaries non-enlarged, no adnexal masses  Rectal: deferred  Lymphatic: no evidence of inguinal lymphadenopathy Extremities: no edema, erythema, or tenderness Neurologic: Grossly intact Psychiatric: mood appropriate, affect full  Female chaperone present  for pelvic and breast  portions of the physical exam    Assessment: 37 y.o. G5P0010 routine annual exam  Plan: Problem List Items Addressed This Visit    None    Visit Diagnoses    Screening for malignant neoplasm of cervix    -  Primary   Relevant Orders   PapIG, HPV, rfx 16/18   Encounter for gynecological examination without abnormal finding       Relevant Orders   PapIG, HPV, rfx 16/18   Breast screening       Atopic dermatitis, unspecified type          2) STI screening  was notoffered and therefore not obtained  2)  ASCCP guidelines and rational discussed.  Patient opts for yearly screening interval  3) Contraception - the patient is currently using  OCP (estrogen/progesterone).  She is happy with her current form of contraception and plans to continue - HTN well controlled, will be over 38 next year.  Discussed WHO and CDC recommendation for progestin only contraceptive with option being po, depo, IUD, nexplanon, BTL or vasectomy.  She is also using for cycle control  so surgical options likely would not address this.  4) Routine healthcare maintenance including cholesterol, diabetes screening discussed managed by PCP  5) Atopic dermatitis - rx triamcinolone  6)  Return in about 1 year (around 03/14/2019) for annual.   Vena Austria, MD, [facility], [facility] 03/13/2018, 8:55 AM

## 2018-03-16 LAB — PAPIG, HPV, RFX 16/18
HPV, high-risk: NEGATIVE
PAP Smear Comment: 0

## 2018-04-19 ENCOUNTER — Emergency Department
Admission: EM | Admit: 2018-04-19 | Discharge: 2018-04-19 | Disposition: A | Discharge status: Home / Self Care | Payer: BLUE CROSS/BLUE SHIELD | Attending: Emergency Medicine | Admitting: Emergency Medicine

## 2018-04-19 ENCOUNTER — Emergency Department: Payer: BLUE CROSS/BLUE SHIELD

## 2018-04-19 ENCOUNTER — Other Ambulatory Visit: Payer: Self-pay

## 2018-04-19 DIAGNOSIS — G43109 Migraine with aura, not intractable, without status migrainosus: Secondary | ICD-10-CM | POA: Insufficient documentation

## 2018-04-19 DIAGNOSIS — E119 Type 2 diabetes mellitus without complications: Secondary | ICD-10-CM | POA: Insufficient documentation

## 2018-04-19 DIAGNOSIS — H538 Other visual disturbances: Secondary | ICD-10-CM | POA: Diagnosis not present

## 2018-04-19 DIAGNOSIS — R4781 Slurred speech: Secondary | ICD-10-CM | POA: Insufficient documentation

## 2018-04-19 DIAGNOSIS — Z79899 Other long term (current) drug therapy: Secondary | ICD-10-CM | POA: Insufficient documentation

## 2018-04-19 DIAGNOSIS — M6281 Muscle weakness (generalized): Secondary | ICD-10-CM | POA: Diagnosis not present

## 2018-04-19 DIAGNOSIS — R51 Headache: Secondary | ICD-10-CM | POA: Diagnosis not present

## 2018-04-19 LAB — CBC
HCT: 39.6 % (ref 35.0–47.0)
Hemoglobin: 13.5 g/dL (ref 12.0–16.0)
MCH: 29.7 pg (ref 26.0–34.0)
MCHC: 34.2 g/dL (ref 32.0–36.0)
MCV: 86.8 fL (ref 80.0–100.0)
PLATELETS: 260 10*3/uL (ref 150–440)
RBC: 4.56 MIL/uL (ref 3.80–5.20)
RDW: 13.7 % (ref 11.5–14.5)
WBC: 8.7 10*3/uL (ref 3.6–11.0)

## 2018-04-19 LAB — BASIC METABOLIC PANEL
ANION GAP: 11 (ref 5–15)
BUN: 11 mg/dL (ref 6–20)
CALCIUM: 9.5 mg/dL (ref 8.9–10.3)
CO2: 26 mmol/L (ref 22–32)
Chloride: 101 mmol/L (ref 98–111)
Creatinine, Ser: 0.65 mg/dL (ref 0.44–1.00)
Glucose, Bld: 133 mg/dL — ABNORMAL HIGH (ref 70–99)
Potassium: 3.8 mmol/L (ref 3.5–5.1)
SODIUM: 138 mmol/L (ref 135–145)

## 2018-04-19 LAB — SEDIMENTATION RATE: Sed Rate: 1 mm/hr (ref 0–20)

## 2018-04-19 MED ORDER — ASPIRIN 81 MG PO CHEW
324.0000 mg | CHEWABLE_TABLET | Freq: Once | ORAL | Status: DC
  Filled 2018-04-19: qty 4

## 2018-04-19 MED ORDER — LORAZEPAM 2 MG/ML IJ SOLN
INTRAMUSCULAR | Status: AC
  Administered 2018-04-19: 2 mg
  Filled 2018-04-19: qty 1

## 2018-04-19 MED ORDER — METOCLOPRAMIDE HCL 5 MG/ML IJ SOLN
10.0000 mg | Freq: Once | INTRAMUSCULAR | Status: AC
  Administered 2018-04-19: 10 mg via INTRAVENOUS

## 2018-04-19 MED ORDER — TETRACAINE HCL 0.5 % OP SOLN
OPHTHALMIC | Status: AC
  Administered 2018-04-19: 18:00:00
  Filled 2018-04-19: qty 4

## 2018-04-19 MED ORDER — METOCLOPRAMIDE HCL 5 MG/ML IJ SOLN
INTRAMUSCULAR | Status: AC
  Filled 2018-04-19: qty 2

## 2018-04-19 MED ORDER — KETOROLAC TROMETHAMINE 30 MG/ML IJ SOLN
INTRAMUSCULAR | Status: AC
  Filled 2018-04-19: qty 1

## 2018-04-19 MED ORDER — BUTALBITAL-APAP-CAFFEINE 50-325-40 MG PO TABS: 1.0000 | ORAL_TABLET | Freq: Four times a day (QID) | ORAL | 0 refills | Status: DC | PRN

## 2018-04-19 MED ORDER — KETOROLAC TROMETHAMINE 30 MG/ML IJ SOLN
15.0000 mg | Freq: Once | INTRAMUSCULAR | Status: AC
  Administered 2018-04-19: 15 mg via INTRAVENOUS

## 2018-04-19 NOTE — ED Notes (Signed)
Urine pregnancy test negative

## 2018-04-19 NOTE — ED Triage Notes (Signed)
Pt arrives from [facility] for "pain at left temporal x 10 days." states it's not really like a headache. States "I feel like I'm squinting more to see on that side." states blurred vision on L side.  alert, oriented, ambulatory. No distress noted.

## 2018-04-19 NOTE — ED Provider Notes (Signed)
[facility] Provider Note  ____________________________________________  Time seen: Approximately 3:56 PM  I have reviewed the triage vital signs and the nursing notes.   HISTORY  Chief Complaint Headache   HPI Alonnie Bieker is a 37 y.o. female with a history of diabetes and hyperlipidemia who presents for evaluation of blurry vision.  Patient reports that she has had a discomfort in the left temporal region for about 10 days.  She reports that is not a headache as it is more outside of her head in the temporal region.  That discomfort has been there constantly and nonradiating, worse with palpation of the L temporal region.  Over the last 5 to 6 days that she has had blurry vision on the left eye only.  She denies jaw claudication.  She has also noticed slightly decrease in strength of her left upper extremity and has had mild slurred speech with certain words.  No right lower extremity weakness, no facial droop, no dysphasia.  No gait instability.  No dizziness.  Patient denies any trauma.  No family history of stroke.  She is not a smoker.  She does take OCP.  No night sweats, no weight loss.   Past Medical History:  Diagnosis Date  . Diabetes mellitus without complication (HCC)   . History of gestational diabetes   . Hyperlipidemia     There are no active problems to display for this patient.   Past Surgical History:  Procedure Laterality Date  . TONSILLECTOMY      Prior to Admission medications   Medication Sig Start Date End Date Taking? Authorizing Provider  norgestimate-ethinyl estradiol (ORTHO-CYCLEN,SPRINTEC,PREVIFEM) 0.25-35 MG-MCG tablet Take 1 tablet by mouth daily. 03/13/18  Yes Vena Austria, MD  butalbital-acetaminophen-caffeine (FIORICET, ESGIC) [phone] MG tablet Take 1-2 tablets by mouth every 6 (six) hours as needed for headache. 04/19/18 04/19/19  Don Perking, [place], [place]  triamcinolone cream (KENALOG) 0.1 %  Apply 1 application topically 2 (two) times daily. Patient not taking: Reported on 04/19/2018 03/13/18   Vena Austria, MD    Allergies Patient has no known allergies.  Family History  Problem Relation Age of Onset  . Bladder Cancer Maternal Grandfather   . Lung cancer Paternal Grandmother   . Lung cancer Paternal Grandfather   . Breast cancer Paternal Aunt 35    Social History Social History   Tobacco Use  . Smoking status: Never Smoker  . Smokeless tobacco: Never Used  Substance Use Topics  . Alcohol use: Yes    Alcohol/week: 0.0 oz    Comment: occasional  . Drug use: No    Review of Systems  Constitutional: Negative for fever. Eyes: + L eye blurry vision ENT: Negative for sore throat. Neck: No neck pain  Cardiovascular: Negative for chest pain. Respiratory: Negative for shortness of breath. Gastrointestinal: Negative for abdominal pain, vomiting or diarrhea. Genitourinary: Negative for dysuria. Musculoskeletal: Negative for back pain. Skin: Negative for rash. Neurological: + L temporal region pain, LUE weakness, slurred speech Psych: No SI or HI  ____________________________________________   PHYSICAL EXAM:  VITAL SIGNS: ED Triage Vitals  Enc Vitals Group     BP 04/19/18 1253 (!) 152/97     Pulse Rate 04/19/18 1253 95     Resp 04/19/18 1253 18     Temp 04/19/18 1253 98.1 F (36.7 C)     Temp Source 04/19/18 1253 Oral     SpO2 04/19/18 1253 99 %     Weight 04/19/18 1254 238  lb (108 kg)     Height 04/19/18 1254 5\' 10"  (1.778 m)     Head Circumference --      Peak Flow --      Pain Score 04/19/18 1254 8     Pain Loc --      Pain Edu? --      Excl. in GC? --     Constitutional: Alert and oriented. Well appearing and in no apparent distress. HEENT:      Head: Normocephalic and atraumatic.  Tenderness to palpation over the left temporal artery, no erythema, no swelling         Eyes: Conjunctivae are normal. Sclera is non-icteric. EOMI, visual field  is intact, visual acuity 20/20 bilaterally, IOP 19 bilaterally      Mouth/Throat: Mucous membranes are moist.       Neck: Supple with no signs of meningismus. Cardiovascular: Regular rate and rhythm. No murmurs, gallops, or rubs. 2+ symmetrical distal pulses are present in all extremities. No JVD. Respiratory: Normal respiratory effort. Lungs are clear to auscultation bilaterally. No wheezes, crackles, or rhonchi.  Gastrointestinal: Soft, non tender, and non distended with positive bowel sounds. No rebound or guarding. Musculoskeletal: Nontender with normal range of motion in all extremities. No edema, cyanosis, or erythema of extremities. Neurologic: Normal speech and language. A & O x3, PERRL, EOMI, no nystagmus, CN II-XII intact, motor testing reveals good tone and bulk throughout. There is no evidence of pronator drift or dysmetria. Muscle strength is 5-/5 on the LUE and 5/5 on all extremities. Sensory examination is intact. Gait is normal. Skin: Skin is warm, dry and intact. No rash noted. Psychiatric: Mood and affect are normal. Speech and behavior are normal.  ____________________________________________   LABS (all labs ordered are listed, but only abnormal results are displayed)  Labs Reviewed  BASIC METABOLIC PANEL - Abnormal; Notable for the following components:      Result Value   Glucose, Bld 133 (*)    All other components within normal limits  CBC  SEDIMENTATION RATE  C-REACTIVE PROTEIN   ____________________________________________  EKG  none ____________________________________________  RADIOLOGY  I have personally reviewed the images performed during this visit and I agree with the Radiologist's read.   Interpretation by Radiologist:  Ct Head Wo Contrast  Result Date: 04/19/2018 CLINICAL DATA:  Pain near the left temple for the past 10 days. Blurry vision on the left. EXAM: CT HEAD WITHOUT CONTRAST TECHNIQUE: Contiguous axial images were obtained from the  base of the skull through the vertex without intravenous contrast. COMPARISON:  None. FINDINGS: Brain: No evidence of acute infarction, hemorrhage, hydrocephalus, extra-axial collection or mass lesion/mass effect. Vascular: No hyperdense vessel or unexpected calcification. Skull: Normal. Negative for fracture or focal lesion. Sinuses/Orbits: No acute finding. Other: None. IMPRESSION: 1. Normal noncontrast head CT. Electronically Signed   By: Obie DredgeWilliam T Derry M.D.   On: 04/19/2018 14:41   Mr Brain Wo Contrast  Result Date: 04/19/2018 CLINICAL DATA:  Headache in the left temporal region beginning 9 days ago. Weakness of the left hand. Speech disturbance. Blurred vision. EXAM: MRI HEAD WITHOUT CONTRAST TECHNIQUE: Multiplanar, multiecho pulse sequences of the brain and surrounding structures were obtained without intravenous contrast. COMPARISON:  CT same day FINDINGS: Brain: Brain has normal appearance without evidence of malformation, atrophy, old or acute small or large vessel infarction, mass lesion, hemorrhage, hydrocephalus or extra-axial collection. Vascular: Major vessels at the base of the brain show flow. Venous sinuses appear patent. Skull and upper cervical  spine: Normal. Sinuses/Orbits: Clear/normal. Other: None significant. IMPRESSION: Normal examination. No abnormality seen to explain the presenting symptoms. Electronically Signed   By: Paulina Fusi M.D.   On: 04/19/2018 17:02    ____________________________________________   PROCEDURES  Procedure(s) performed: None Procedures Critical Care performed:  None ____________________________________________   INITIAL IMPRESSION / ASSESSMENT AND PLAN / ED COURSE   37 y.o. female with a history of diabetes and hyperlipidemia who presents for evaluation of blurry vision, L tempora tenderness, LUE weakness.  Patient is well-appearing, she is tender to palpation over the left temporal artery with no swelling, cord, or erythema, she has slightly  decreased strength on the left upper extremity but other than that her physical exam is within normal limits.  Initially I was concerned for giant cell arteritis although patient is fairly young.  However her inflammatory markers are all within normal limits and I would not expect GCA to cause left upper extremity weakness and slurred speech.  I do not appreciate any slurring of her speech at this time.  Therefore patient was evaluated for stroke.  CT head was negative.  She was given an aspirin.  MRI is pending.  Eye exam is normal with intact and painless extraocular movements, intact pupillary reflex, normal visual fields, normal IOP, and normal visual acuity.  _________________________ 6:16 PM on 04/19/2018 -----------------------------------------  MRI negative for stroke.  At this time most likely diagnosis would be a complex migraine.  Patient was treated with a migraine cocktail. With normal inflammatory markers, and other symptoms such as slurred speech and LUE weakness, GCA is less likely. At this time starting patient on high dose steroids would have higher risks than benefits including hyperglycemia and DKA in a patient with h/o DM and not on insulin at home. Plan is for close f/u with PCP. Patient will be given fioricet for migraines at home.  I discussed strict return precautions with patient including any signs of acute stroke, worsening visual changes, severe headache.      As part of my medical decision making, I reviewed the following data within the electronic MEDICAL RECORD NUMBER Nursing notes reviewed and incorporated, Labs reviewed , Old chart reviewed, Radiograph reviewed , Notes from prior ED visits and [place] Controlled Substance Database    Pertinent labs & imaging results that were available during my care of the patient were reviewed by me and considered in my medical decision making (see chart for details).    ____________________________________________   FINAL CLINICAL  IMPRESSION(S) / ED DIAGNOSES  Final diagnoses:  Complicated migraine      NEW MEDICATIONS STARTED DURING THIS VISIT:  ED Discharge Orders        Ordered    butalbital-acetaminophen-caffeine (FIORICET, ESGIC) 50-325-40 MG tablet  Every 6 hours PRN     04/19/18 1815       Note:  This document was prepared using Dragon voice recognition software and may include unintentional dictation errors.    Don Perking, [place], MD 04/19/18 [id number]

## 2018-04-19 NOTE — ED Notes (Signed)
Pt presents to ED for headache to left temple that began 9 days ago. Pt reports mild weakness to left hand and "trouble speaking." +blurred vision to left eye for past 3 days. Pt denies any weakness to legs or trouble walking. Denies CP or SOB. Pt denies any hx of headaches or migraines in the past.

## 2018-04-24 DIAGNOSIS — G43011 Migraine without aura, intractable, with status migrainosus: Secondary | ICD-10-CM | POA: Diagnosis not present

## 2018-04-24 DIAGNOSIS — E119 Type 2 diabetes mellitus without complications: Secondary | ICD-10-CM | POA: Diagnosis not present

## 2018-04-24 DIAGNOSIS — E7800 Pure hypercholesterolemia, unspecified: Secondary | ICD-10-CM | POA: Diagnosis not present

## 2018-04-25 DIAGNOSIS — E7800 Pure hypercholesterolemia, unspecified: Secondary | ICD-10-CM | POA: Diagnosis not present

## 2018-04-25 DIAGNOSIS — E119 Type 2 diabetes mellitus without complications: Secondary | ICD-10-CM | POA: Diagnosis not present

## 2018-07-23 DIAGNOSIS — E119 Type 2 diabetes mellitus without complications: Secondary | ICD-10-CM | POA: Diagnosis not present

## 2018-07-23 DIAGNOSIS — E782 Mixed hyperlipidemia: Secondary | ICD-10-CM | POA: Diagnosis not present

## 2018-07-30 DIAGNOSIS — E1169 Type 2 diabetes mellitus with other specified complication: Secondary | ICD-10-CM | POA: Diagnosis not present

## 2018-07-30 DIAGNOSIS — E6609 Other obesity due to excess calories: Secondary | ICD-10-CM | POA: Insufficient documentation

## 2018-07-30 DIAGNOSIS — Z23 Encounter for immunization: Secondary | ICD-10-CM | POA: Diagnosis not present

## 2018-07-30 DIAGNOSIS — E7800 Pure hypercholesterolemia, unspecified: Secondary | ICD-10-CM | POA: Diagnosis not present

## 2018-07-30 DIAGNOSIS — G43011 Migraine without aura, intractable, with status migrainosus: Secondary | ICD-10-CM | POA: Diagnosis not present

## 2018-07-30 DIAGNOSIS — E119 Type 2 diabetes mellitus without complications: Secondary | ICD-10-CM | POA: Diagnosis not present

## 2018-08-28 DIAGNOSIS — M545 Low back pain: Secondary | ICD-10-CM | POA: Diagnosis not present

## 2018-12-03 DIAGNOSIS — E7800 Pure hypercholesterolemia, unspecified: Secondary | ICD-10-CM | POA: Diagnosis not present

## 2018-12-03 DIAGNOSIS — E785 Hyperlipidemia, unspecified: Secondary | ICD-10-CM | POA: Diagnosis not present

## 2018-12-03 DIAGNOSIS — E1169 Type 2 diabetes mellitus with other specified complication: Secondary | ICD-10-CM | POA: Diagnosis not present

## 2018-12-05 DIAGNOSIS — E7800 Pure hypercholesterolemia, unspecified: Secondary | ICD-10-CM | POA: Diagnosis not present

## 2018-12-05 DIAGNOSIS — E6609 Other obesity due to excess calories: Secondary | ICD-10-CM | POA: Diagnosis not present

## 2018-12-05 DIAGNOSIS — E785 Hyperlipidemia, unspecified: Secondary | ICD-10-CM | POA: Diagnosis not present

## 2018-12-05 DIAGNOSIS — E1169 Type 2 diabetes mellitus with other specified complication: Secondary | ICD-10-CM | POA: Diagnosis not present

## 2019-03-25 ENCOUNTER — Encounter: Payer: Self-pay | Admitting: Obstetrics and Gynecology

## 2019-03-25 ENCOUNTER — Other Ambulatory Visit (HOSPITAL_COMMUNITY)
Admission: RE | Admit: 2019-03-25 | Discharge: 2019-03-25 | Disposition: A | Discharge status: Home / Self Care | Payer: BC Managed Care – PPO | Source: Ambulatory Visit | Attending: Obstetrics and Gynecology | Admitting: Obstetrics and Gynecology

## 2019-03-25 ENCOUNTER — Ambulatory Visit (INDEPENDENT_AMBULATORY_CARE_PROVIDER_SITE_OTHER): Payer: BC Managed Care – PPO | Admitting: Obstetrics and Gynecology

## 2019-03-25 ENCOUNTER — Other Ambulatory Visit: Payer: Self-pay

## 2019-03-25 VITALS — BP 122/82 | HR 77 | Ht 70.0 in | Wt 225.0 lb

## 2019-03-25 DIAGNOSIS — Z124 Encounter for screening for malignant neoplasm of cervix: Secondary | ICD-10-CM

## 2019-03-25 DIAGNOSIS — Z01419 Encounter for gynecological examination (general) (routine) without abnormal findings: Secondary | ICD-10-CM | POA: Diagnosis not present

## 2019-03-25 DIAGNOSIS — Z1239 Encounter for other screening for malignant neoplasm of breast: Secondary | ICD-10-CM

## 2019-03-25 MED ORDER — SLYND 4 MG PO TABS: 1.0000 | ORAL_TABLET | Freq: Every day | ORAL | 1 refills | Status: DC

## 2019-03-25 NOTE — Progress Notes (Signed)
Gynecology Annual Exam   PCP: Dion Body, MD  Chief Complaint:  Chief Complaint  Patient presents with  . Gynecologic Exam    Would like BC    History of Present Illness: Patient is a 38 y.o. [id number] presents for annual exam. The patient has no complaints today.   LMP: Patient's last menstrual period was 03/19/2019 (exact date). Average Interval: regular, 28 days Duration of flow: 5 days Heavy Menses: no Clots: no Intermenstrual Bleeding: no Postcoital Bleeding: no Dysmenorrhea: no  The patient is sexually active. She was using OCP (estrogen/progesterone) for contraception. She denies dyspareunia.  The patient does perform self breast exams.  There is no notable family history of breast or ovarian cancer in her family.  The patient wears seatbelts: yes.   The patient has regular exercise: not asked.    The patient denies current symptoms of depression.    Review of Systems: Review of Systems  Constitutional: Negative for chills and fever.  HENT: Negative for congestion.   Respiratory: Negative for cough and shortness of breath.   Cardiovascular: Negative for chest pain and palpitations.  Gastrointestinal: Negative for abdominal pain, constipation, diarrhea, heartburn, nausea and vomiting.  Genitourinary: Negative for dysuria, frequency and urgency.  Skin: Negative for itching and rash.  Neurological: Negative for dizziness and headaches.  Endo/Heme/Allergies: Negative for polydipsia.  Psychiatric/Behavioral: Negative for depression.    Past Medical History:  Past Medical History:  Diagnosis Date  . Diabetes mellitus without complication ([place])   . History of gestational diabetes   . Hyperlipidemia     Past Surgical History:  Past Surgical History:  Procedure Laterality Date  . TONSILLECTOMY      Gynecologic History:  Patient's last menstrual period was 03/19/2019 (exact date). Contraception: OCP (estrogen/progesterone) Last Pap: Results were:  03/13/2018 NIL and HR HPV negative   Obstetric History: [id number]  Family History:  Family History  Problem Relation Age of Onset  . Bladder Cancer Maternal Grandfather   . Lung cancer Paternal Grandmother   . Lung cancer Paternal Grandfather   . Breast cancer Paternal Aunt 77    Social History:  Social History   Socioeconomic History  . Marital status: Married    Spouse name: Not on file  . Number of children: Not on file  . Years of education: Not on file  . Highest education level: Not on file  Occupational History  . Not on file  Social Needs  . Financial resource strain: Not on file  . Food insecurity    Worry: Not on file    Inability: Not on file  . Transportation needs    Medical: Not on file    Non-medical: Not on file  Tobacco Use  . Smoking status: Never Smoker  . Smokeless tobacco: Never Used  Substance and Sexual Activity  . Alcohol use: Yes    Alcohol/week: 0.0 standard drinks    Comment: occasional  . Drug use: No  . Sexual activity: Yes    Birth control/protection: None  Lifestyle  . Physical activity    Days per week: 4 days    Minutes per session: 60 min  . Stress: Not on file  Relationships  . Social Herbalist on phone: Not on file    Gets together: Not on file    Attends religious service: Not on file    Active member of club or organization: Not on file    Attends meetings of clubs or organizations: Not  on file    Relationship status: Not on file  . Intimate partner violence    Fear of current or ex partner: Not on file    Emotionally abused: Not on file    Physically abused: Not on file    Forced sexual activity: Not on file  Other Topics Concern  . Not on file  Social History Narrative  . Not on file    Allergies:  No Known Allergies  Medications: Prior to Admission medications   Medication Sig Start Date End Date Taking? Authorizing Provider  Blood Glucose Monitoring Suppl (ONE TOUCH ULTRA 2) w/Device KIT AS  DIRECTED 09/01/18  Yes [provider]  glucose blood (PRECISION QID TEST) test strip Check fasting blood sugar once daily. Use as instructed. ONE TOUCH DX E11.9 07/30/18  Yes [provider]  Lancets 30G MISC Use 1 each as directed (Dx E11.9) Check CBG's fasting once daily. Dx: E11.9 07/30/18  Yes [provider]  lovastatin (MEVACOR) 40 MG tablet  01/22/19  Yes [provider]  metFORMIN (GLUCOPHAGE) 500 MG tablet  03/03/19  Yes [provider]    Physical Exam Vitals: Blood pressure 122/82, pulse 77, height 5' 10"  (1.778 m), weight 225 lb (102.1 kg), last menstrual period 03/19/2019.  General: NAD HEENT: normocephalic, anicteric Thyroid: no enlargement, no palpable nodules Pulmonary: No increased work of breathing, CTAB Cardiovascular: RRR, distal pulses 2+ Breast: Breast symmetrical, no tenderness, no palpable nodules or masses, no skin or nipple retraction present, no nipple discharge.  No axillary or supraclavicular lymphadenopathy. Abdomen: NABS, soft, non-tender, non-distended.  Umbilicus without lesions.  No hepatomegaly, splenomegaly or masses palpable. No evidence of hernia  Genitourinary:  External: Normal external female genitalia.  Normal urethral meatus, normal Bartholin's and Skene's glands.    Vagina: Normal vaginal mucosa, no evidence of prolapse.    Cervix: Grossly normal in appearance, no bleeding  Uterus: Non-enlarged, mobile, normal contour.  No CMT  Adnexa: ovaries non-enlarged, no adnexal masses  Rectal: deferred  Lymphatic: no evidence of inguinal lymphadenopathy Extremities: no edema, erythema, or tenderness Neurologic: Grossly intact Psychiatric: mood appropriate, affect full  Female chaperone present for pelvic and breast  portions of the physical exam    Assessment: 38 y.o. [id number] routine annual exam  Plan: Problem List Items Addressed This Visit    None    Visit Diagnoses    Encounter for gynecological  examination without abnormal finding    -  Primary   Breast screening       Relevant Orders   MM 3D SCREEN BREAST BILATERAL   Screening for malignant neoplasm of cervix       Relevant Orders   Cytology - PAP      1) STI screening  was notoffered and therefore not obtained  2)  ASCCP guidelines and rational discussed.  Patient opts for yearly screening interval  3) Contraception - the patient is currently using  none, was previously on combination OCP.  She is interested in changing to OCP - 2 month trial of POP Slynd given over 40 and HTN  4) Routine healthcare maintenance including cholesterol, diabetes screening discussed managed by PCP  5) Mammogram ordered  6) Return in about 1 year (around 03/24/2020) for annual.   Malachy Mood, MD, [place], [facility] 03/25/2019, 1:44 PM

## 2019-03-25 NOTE — Patient Instructions (Signed)
[facility] [address]  [place] [address]  [facility] [place]  [address] [place] [address]  Phone: [phone]  

## 2019-03-27 LAB — CYTOLOGY - PAP
Diagnosis: NEGATIVE
HPV: NOT DETECTED

## 2019-04-01 ENCOUNTER — Ambulatory Visit
Admission: RE | Admit: 2019-04-01 | Discharge: 2019-04-01 | Disposition: A | Discharge status: Home / Self Care | Payer: BC Managed Care – PPO | Source: Ambulatory Visit | Attending: Obstetrics and Gynecology | Admitting: Obstetrics and Gynecology

## 2019-04-01 ENCOUNTER — Other Ambulatory Visit: Payer: Self-pay

## 2019-04-01 DIAGNOSIS — Z1231 Encounter for screening mammogram for malignant neoplasm of breast: Secondary | ICD-10-CM | POA: Insufficient documentation

## 2019-04-01 DIAGNOSIS — Z1239 Encounter for other screening for malignant neoplasm of breast: Secondary | ICD-10-CM

## 2019-04-09 DIAGNOSIS — E1169 Type 2 diabetes mellitus with other specified complication: Secondary | ICD-10-CM | POA: Diagnosis not present

## 2019-04-09 DIAGNOSIS — E7800 Pure hypercholesterolemia, unspecified: Secondary | ICD-10-CM | POA: Diagnosis not present

## 2019-04-09 DIAGNOSIS — E785 Hyperlipidemia, unspecified: Secondary | ICD-10-CM | POA: Diagnosis not present

## 2019-04-16 DIAGNOSIS — E6609 Other obesity due to excess calories: Secondary | ICD-10-CM | POA: Diagnosis not present

## 2019-04-16 DIAGNOSIS — E7800 Pure hypercholesterolemia, unspecified: Secondary | ICD-10-CM | POA: Diagnosis not present

## 2019-04-16 DIAGNOSIS — Z0000 Encounter for general adult medical examination without abnormal findings: Secondary | ICD-10-CM | POA: Diagnosis not present

## 2019-04-16 DIAGNOSIS — E1169 Type 2 diabetes mellitus with other specified complication: Secondary | ICD-10-CM | POA: Diagnosis not present

## 2019-05-04 DIAGNOSIS — Z20828 Contact with and (suspected) exposure to other viral communicable diseases: Secondary | ICD-10-CM | POA: Diagnosis not present

## 2019-05-21 ENCOUNTER — Other Ambulatory Visit: Payer: Self-pay | Admitting: Obstetrics and Gynecology

## 2019-05-21 ENCOUNTER — Other Ambulatory Visit: Payer: Self-pay

## 2019-05-21 MED ORDER — SLYND 4 MG PO TABS: 1.0000 | ORAL_TABLET | Freq: Every day | ORAL | 3 refills | Status: DC

## 2019-05-21 MED ORDER — SLYND 4 MG PO TABS: 1.0000 | ORAL_TABLET | Freq: Every day | ORAL | 2 refills | Status: DC

## 2019-08-13 DIAGNOSIS — E1169 Type 2 diabetes mellitus with other specified complication: Secondary | ICD-10-CM | POA: Diagnosis not present

## 2019-08-13 DIAGNOSIS — E7800 Pure hypercholesterolemia, unspecified: Secondary | ICD-10-CM | POA: Diagnosis not present

## 2019-08-13 DIAGNOSIS — E785 Hyperlipidemia, unspecified: Secondary | ICD-10-CM | POA: Diagnosis not present

## 2019-08-20 DIAGNOSIS — E6609 Other obesity due to excess calories: Secondary | ICD-10-CM | POA: Diagnosis not present

## 2019-08-20 DIAGNOSIS — E1169 Type 2 diabetes mellitus with other specified complication: Secondary | ICD-10-CM | POA: Diagnosis not present

## 2019-08-20 DIAGNOSIS — E7800 Pure hypercholesterolemia, unspecified: Secondary | ICD-10-CM | POA: Diagnosis not present

## 2019-08-20 DIAGNOSIS — E785 Hyperlipidemia, unspecified: Secondary | ICD-10-CM | POA: Diagnosis not present

## 2020-03-03 ENCOUNTER — Other Ambulatory Visit: Payer: Self-pay | Admitting: Obstetrics and Gynecology

## 2020-03-03 DIAGNOSIS — Z1231 Encounter for screening mammogram for malignant neoplasm of breast: Secondary | ICD-10-CM

## 2020-03-30 ENCOUNTER — Ambulatory Visit: Payer: BC Managed Care – PPO | Admitting: Obstetrics and Gynecology

## 2020-04-01 ENCOUNTER — Ambulatory Visit
Admission: RE | Admit: 2020-04-01 | Discharge: 2020-04-01 | Disposition: A | Discharge status: Home / Self Care | Payer: BC Managed Care – PPO | Source: Ambulatory Visit | Attending: Obstetrics and Gynecology | Admitting: Obstetrics and Gynecology

## 2020-04-01 DIAGNOSIS — Z1231 Encounter for screening mammogram for malignant neoplasm of breast: Secondary | ICD-10-CM | POA: Insufficient documentation

## 2020-04-27 ENCOUNTER — Ambulatory Visit: Payer: BC Managed Care – PPO | Admitting: Obstetrics and Gynecology

## 2020-05-27 ENCOUNTER — Other Ambulatory Visit: Payer: Self-pay

## 2020-05-27 ENCOUNTER — Ambulatory Visit (INDEPENDENT_AMBULATORY_CARE_PROVIDER_SITE_OTHER): Payer: BC Managed Care – PPO | Admitting: Obstetrics and Gynecology

## 2020-05-27 ENCOUNTER — Encounter: Payer: Self-pay | Admitting: Obstetrics and Gynecology

## 2020-05-27 ENCOUNTER — Other Ambulatory Visit (HOSPITAL_COMMUNITY)
Admission: RE | Admit: 2020-05-27 | Discharge: 2020-05-27 | Disposition: A | Discharge status: Home / Self Care | Payer: BC Managed Care – PPO | Source: Ambulatory Visit | Attending: Obstetrics and Gynecology | Admitting: Obstetrics and Gynecology

## 2020-05-27 VITALS — BP 110/70 | HR 72 | Ht 70.0 in | Wt 225.0 lb

## 2020-05-27 DIAGNOSIS — Z01419 Encounter for gynecological examination (general) (routine) without abnormal findings: Secondary | ICD-10-CM

## 2020-05-27 DIAGNOSIS — Z124 Encounter for screening for malignant neoplasm of cervix: Secondary | ICD-10-CM | POA: Insufficient documentation

## 2020-05-27 DIAGNOSIS — Z1239 Encounter for other screening for malignant neoplasm of breast: Secondary | ICD-10-CM

## 2020-05-27 DIAGNOSIS — Z3041 Encounter for surveillance of contraceptive pills: Secondary | ICD-10-CM

## 2020-05-27 MED ORDER — SLYND 4 MG PO TABS: 1.0000 | ORAL_TABLET | Freq: Every day | ORAL | 3 refills | Status: DC

## 2020-05-27 NOTE — Progress Notes (Signed)
Gynecology Annual Exam  PCP: Dion Body, MD  Chief Complaint:  Chief Complaint  Patient presents with  . Gynecologic Exam    History of Present Illness: Patient is a 39 y.o. [id number] presents for annual exam. The patient has no complaints today.   LMP: Patient's last menstrual period was 04/23/2020. No menstrual complaints   The patient is sexually active. She currently uses oral progesterone-only contraceptive for contraception. She denies dyspareunia.  The patient does perform self breast exams.  There is no notable family history of breast or ovarian cancer in her family.  The patient wears seatbelts: yes.   The patient has regular exercise: not asked.    The patient denies current symptoms of depression.    Review of Systems: Review of Systems  Constitutional: Negative for chills and fever.  HENT: Negative for congestion.   Respiratory: Negative for cough and shortness of breath.   Cardiovascular: Negative for chest pain and palpitations.  Gastrointestinal: Negative for abdominal pain, constipation, diarrhea, heartburn, nausea and vomiting.  Genitourinary: Negative for dysuria, frequency and urgency.  Skin: Negative for itching and rash.  Neurological: Negative for dizziness and headaches.  Endo/Heme/Allergies: Negative for polydipsia.  Psychiatric/Behavioral: Negative for depression.    Past Medical History:  There are no problems to display for this patient.   Past Surgical History:  Past Surgical History:  Procedure Laterality Date  . TONSILLECTOMY      Gynecologic History:  Patient's last menstrual period was 04/23/2020. Contraception: oral progesterone-only contraceptive Last Pap: Results were: 03/25/2019 NIL and HR HPV negative  Last mammogram: 04/01/2020 Results were: Jodi Salazar I  Obstetric History: [id number]  Family History:  Family History  Problem Relation Age of Onset  . Bladder Cancer Maternal Grandfather   . Lung cancer Paternal  Grandmother   . Lung cancer Paternal Grandfather   . Breast cancer Paternal Aunt 51  . Breast cancer Cousin     Social History:  Social History   Socioeconomic History  . Marital status: Married    Spouse name: Not on file  . Number of children: Not on file  . Years of education: Not on file  . Highest education level: Not on file  Occupational History  . Not on file  Tobacco Use  . Smoking status: Never Smoker  . Smokeless tobacco: Never Used  Vaping Use  . Vaping Use: Never used  Substance and Sexual Activity  . Alcohol use: Yes    Alcohol/week: 0.0 standard drinks    Comment: occasional  . Drug use: No  . Sexual activity: Yes    Birth control/protection: Pill  Other Topics Concern  . Not on file  Social History Narrative  . Not on file   Social Determinants of Health   Financial Resource Strain:   . Difficulty of Paying Living Expenses: Not on file  Food Insecurity:   . Worried About Charity fundraiser in the Last Year: Not on file  . Ran Out of Food in the Last Year: Not on file  Transportation Needs:   . Lack of Transportation (Medical): Not on file  . Lack of Transportation (Non-Medical): Not on file  Physical Activity:   . Days of Exercise per Week: Not on file  . Minutes of Exercise per Session: Not on file  Stress:   . Feeling of Stress : Not on file  Social Connections:   . Frequency of Communication with Friends and Family: Not on file  . Frequency of Social Gatherings with  Friends and Family: Not on file  . Attends Religious Services: Not on file  . Active Member of Clubs or Organizations: Not on file  . Attends Archivist Meetings: Not on file  . Marital Status: Not on file  Intimate Partner Violence:   . Fear of Current or Ex-Partner: Not on file  . Emotionally Abused: Not on file  . Physically Abused: Not on file  . Sexually Abused: Not on file    Allergies:  No Known Allergies  Medications: Prior to Admission medications     Medication Sig Start Date End Date Taking? Authorizing Provider  Blood Glucose Monitoring Suppl (ONE TOUCH ULTRA 2) w/Device KIT AS DIRECTED 09/01/18  Yes [provider]  Drospirenone (SLYND) 4 MG TABS Take 1 tablet by mouth daily. 05/21/19  Yes Malachy Mood, MD  glucose blood (PRECISION QID TEST) test strip Check fasting blood sugar once daily. Use as instructed. ONE TOUCH DX E11.9 07/30/18  Yes [provider]  Lancets 30G MISC Use 1 each as directed (Dx E11.9) Check CBG's fasting once daily. Dx: E11.9 07/30/18  Yes [provider]  lovastatin (MEVACOR) 40 MG tablet  01/22/19  Yes [provider]  metFORMIN (GLUCOPHAGE) 500 MG tablet  03/03/19  Yes [provider]    Physical Exam Vitals: Blood pressure 110/70, pulse 72, height 5' 10"  (1.778 m), weight 225 lb (102.1 kg), last menstrual period 04/23/2020.  General: NAD HEENT: normocephalic, anicteric Thyroid: no enlargement, no palpable nodules Pulmonary: No increased work of breathing, CTAB Cardiovascular: RRR, distal pulses 2+ Breast: Breast symmetrical, no tenderness, no palpable nodules or masses, no skin or nipple retraction present, no nipple discharge.  No axillary or supraclavicular lymphadenopathy. Abdomen: NABS, soft, non-tender, non-distended.  Umbilicus without lesions.  No hepatomegaly, splenomegaly or masses palpable. No evidence of hernia  Genitourinary:  External: Normal external female genitalia.  Normal urethral meatus, normal Bartholin's and Skene's glands.    Vagina: Normal vaginal mucosa, no evidence of prolapse.    Cervix: Grossly normal in appearance, no bleeding  Uterus: Non-enlarged, mobile, normal contour.  No CMT  Adnexa: ovaries non-enlarged, no adnexal masses  Rectal: deferred  Lymphatic: no evidence of inguinal lymphadenopathy Extremities: no edema, erythema, or tenderness Neurologic: Grossly intact Psychiatric: mood appropriate, affect full  Female  chaperone present for pelvic and breast  portions of the physical exam   There is no immunization history on file for this patient.   Assessment: 39 y.o. [id number] routine annual exam  Plan: Problem List Items Addressed This Visit    None    Visit Diagnoses    Encounter for gynecological examination without abnormal finding    -  Primary   Screening for malignant neoplasm of cervix       Relevant Orders   Cytology - PAP   Breast screening       Surveillance for birth control, oral contraceptives          1) Mammogram - recommend yearly screening mammogram.  Mammogram Is up to date   2) STI screening  was notoffered and therefore not obtained  3) ASCCP guidelines and rational discussed.  Patient opts for yearly screening interval  4) Contraception - the patient is currently using  oral progesterone-only contraceptive.  She is happy with her current form of contraception and plans to continue - HTN and >40 so contraindications to estrogen use - refill slynd  5) Colonoscopy -- start age 79  6) Routine healthcare maintenance including cholesterol, diabetes screening  discussed managed by PCP  7) COVID vaccination status - has been vaccinated   8) Return in about 1 year (around 05/27/2021) for annual.   Malachy Mood, MD, [place], [place] 05/27/2020, 9:04 AM

## 2020-05-30 LAB — CYTOLOGY - PAP
Adequacy: ABSENT
Comment: NEGATIVE
Diagnosis: NEGATIVE
High risk HPV: NEGATIVE

## 2020-09-03 IMAGING — MG DIGITAL SCREENING BILATERAL MAMMOGRAM WITH TOMO AND CAD
8 series · 8 of 24 positions shown · non-contrast
Comparison: Previous exam(s).

CLINICAL DATA: Screening.

EXAM:
DIGITAL SCREENING BILATERAL MAMMOGRAM WITH TOMO AND CAD

[R CC synth-2D]
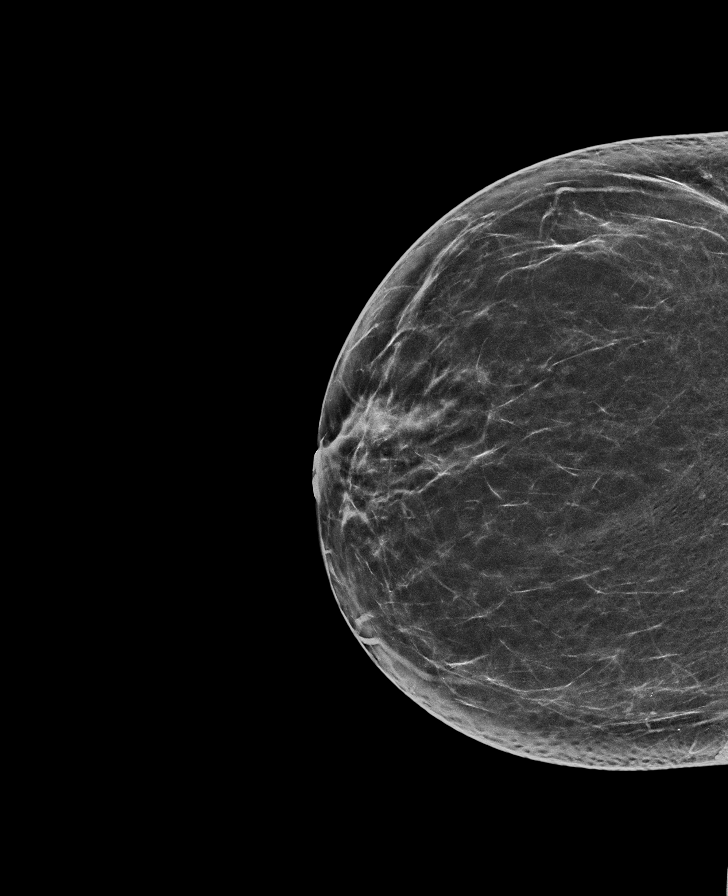

[L MLO synth-2D]
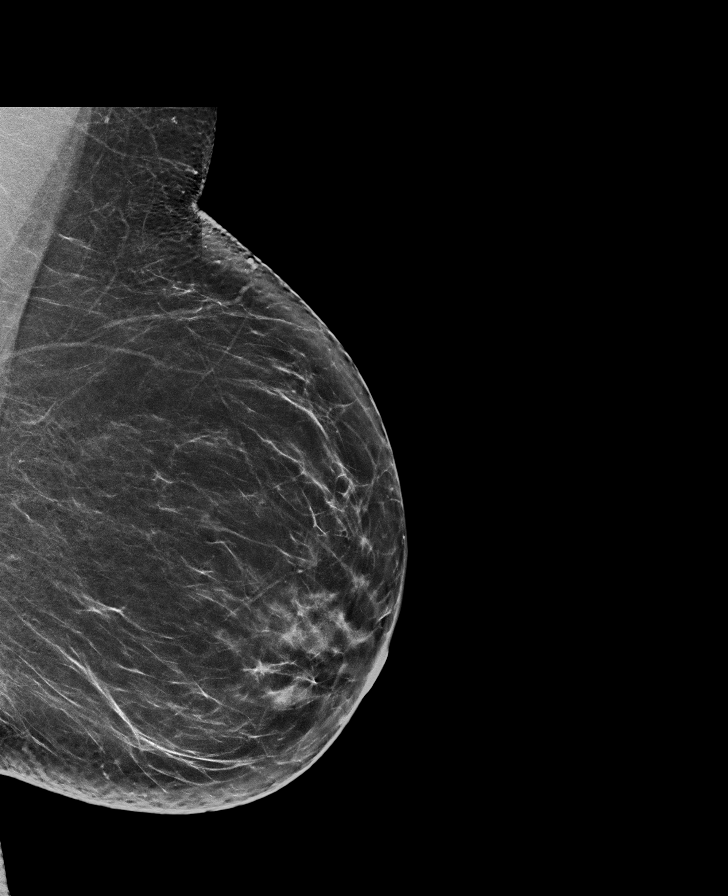

[R MLO synth-2D]
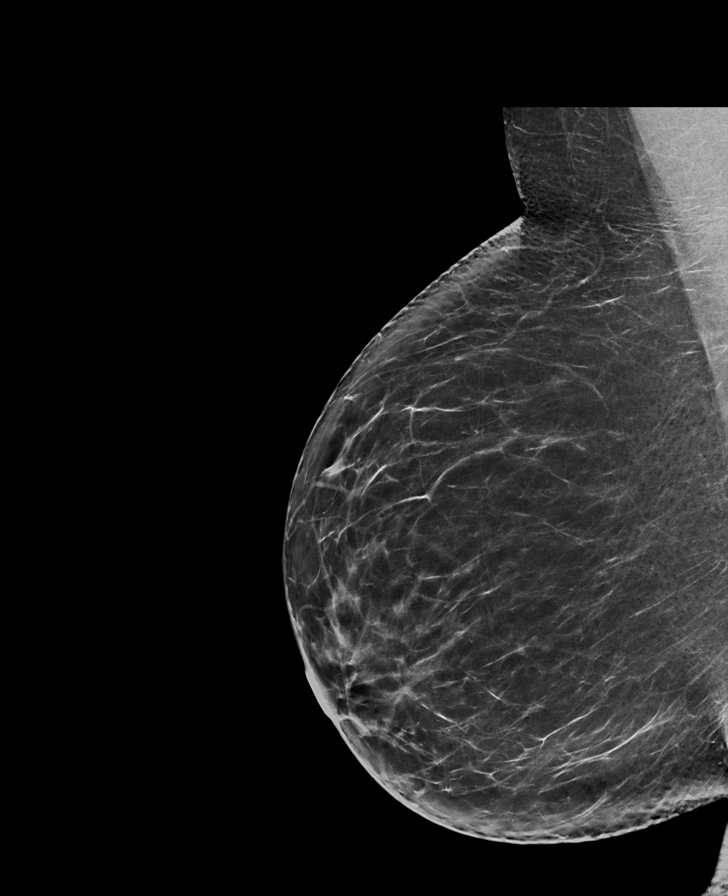

[L CC synth-2D]
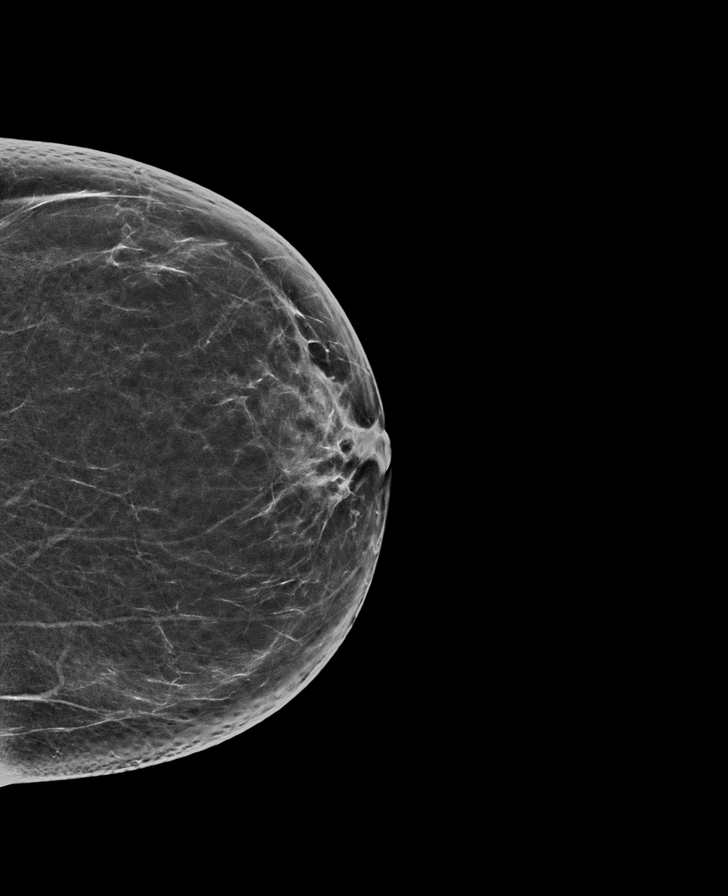

[R MLO tomo · tomo slice 40/79.0]
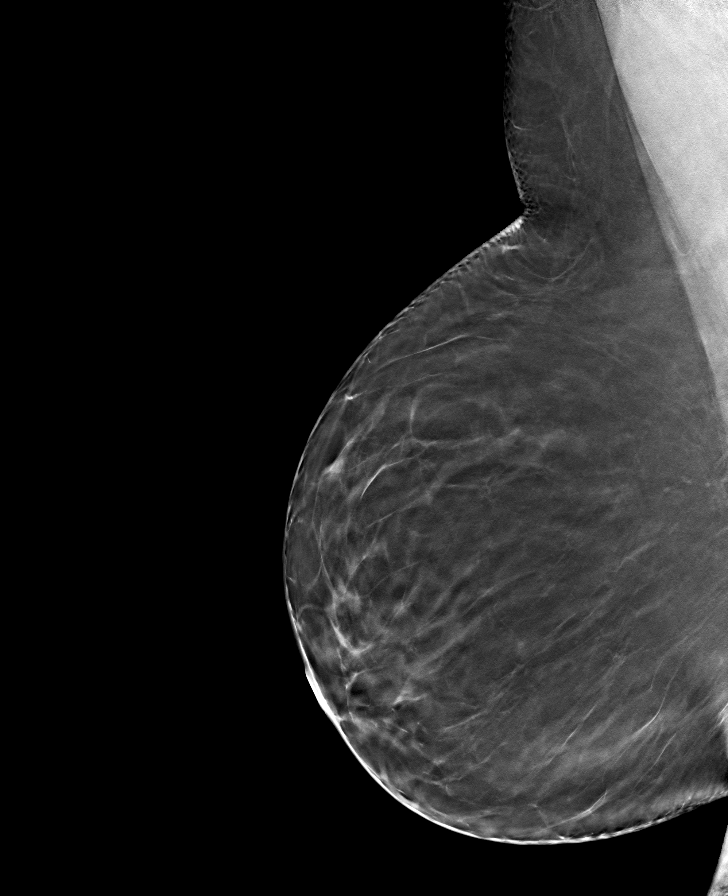

[L MLO tomo · tomo slice 40/79.0]
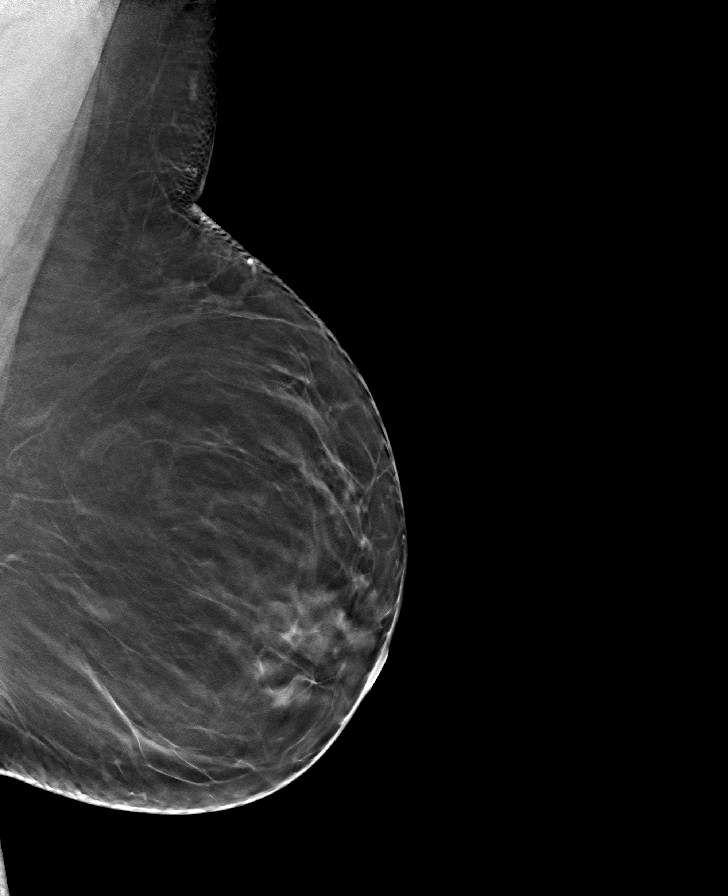

[L CC tomo · tomo slice 35/70.0]
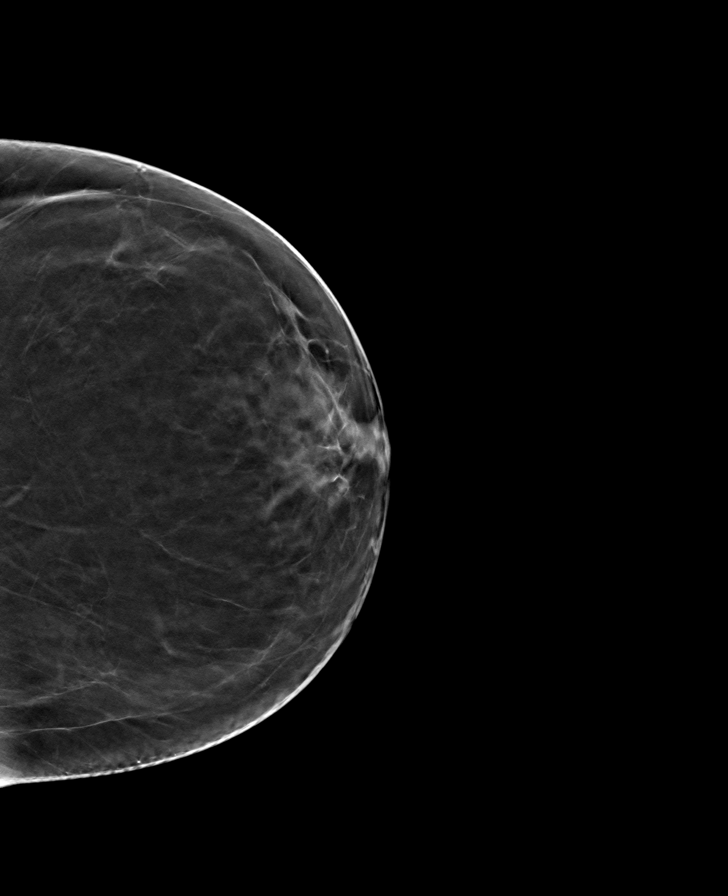

[R CC tomo · tomo slice 35/70.0]
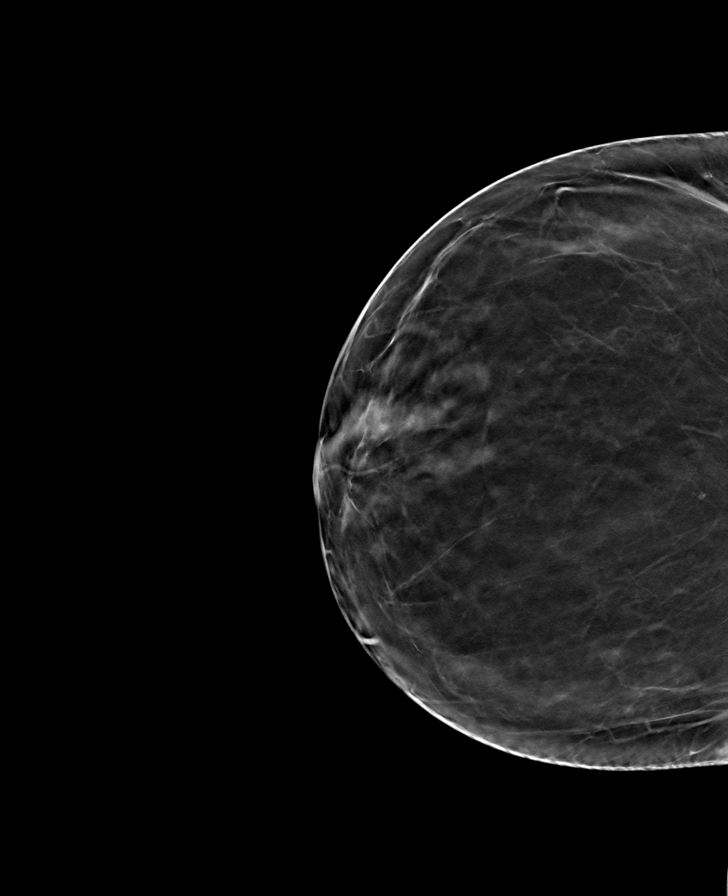

[8 of 24 positions shown; findings below may reference images not displayed]

ACR Breast Density Category b: There are scattered areas of
fibroglandular density.
FINDINGS: There are no findings suspicious for malignancy. Images were
processed with CAD.
IMPRESSION: No mammographic evidence of malignancy. A result letter of this
screening mammogram will be mailed directly to the patient.

RECOMMENDATION:
Screening mammogram in one year. (Code:[id number])

BI-RADS CATEGORY  1: Negative.

## 2020-09-27 ENCOUNTER — Other Ambulatory Visit: Payer: Self-pay | Admitting: Family Medicine

## 2020-09-27 DIAGNOSIS — R1310 Dysphagia, unspecified: Secondary | ICD-10-CM

## 2020-10-19 ENCOUNTER — Ambulatory Visit: Payer: BC Managed Care – PPO

## 2021-05-29 ENCOUNTER — Other Ambulatory Visit: Payer: Self-pay

## 2021-05-29 ENCOUNTER — Ambulatory Visit (INDEPENDENT_AMBULATORY_CARE_PROVIDER_SITE_OTHER): Payer: BC Managed Care – PPO | Admitting: Obstetrics and Gynecology

## 2021-05-29 ENCOUNTER — Encounter: Payer: Self-pay | Admitting: Obstetrics and Gynecology

## 2021-05-29 ENCOUNTER — Other Ambulatory Visit (HOSPITAL_COMMUNITY)
Admission: RE | Admit: 2021-05-29 | Discharge: 2021-05-29 | Disposition: A | Discharge status: Home / Self Care | Payer: BC Managed Care – PPO | Source: Ambulatory Visit | Attending: Obstetrics and Gynecology | Admitting: Obstetrics and Gynecology

## 2021-05-29 VITALS — BP 118/74 | Ht 70.0 in | Wt 212.0 lb

## 2021-05-29 DIAGNOSIS — Z124 Encounter for screening for malignant neoplasm of cervix: Secondary | ICD-10-CM

## 2021-05-29 DIAGNOSIS — N926 Irregular menstruation, unspecified: Secondary | ICD-10-CM

## 2021-05-29 DIAGNOSIS — Z1239 Encounter for other screening for malignant neoplasm of breast: Secondary | ICD-10-CM | POA: Diagnosis not present

## 2021-05-29 DIAGNOSIS — Z01419 Encounter for gynecological examination (general) (routine) without abnormal findings: Secondary | ICD-10-CM

## 2021-05-29 LAB — POCT URINE PREGNANCY: Preg Test, Ur: NEGATIVE

## 2021-05-29 MED ORDER — SLYND 4 MG PO TABS: 1.0000 | ORAL_TABLET | Freq: Every day | ORAL | 3 refills | Status: DC

## 2021-05-29 NOTE — Patient Instructions (Signed)
[facility] [address] [place] [address]  [facility] [place]  [address] [place] [address]  Phone: [phone]  

## 2021-05-29 NOTE — Progress Notes (Signed)
Gynecology Annual Exam  PCP: Dion Body, MD  Chief Complaint:  Chief Complaint  Patient presents with   Gynecologic Exam    Annual - no concerns. RM 5    History of Present Illness: Patient is a 40 y.o. T5T7322 presents for annual exam. The patient has no complaints today.   LMP: Patient's last menstrual period was 04/05/2021. Intermittently skip on slynd but otherwise no menstrual concerns.   The patient is sexually active. She currently uses oral progesterone-only contraceptive for contraception. She denies dyspareunia.  The patient does perform self breast exams.  There is no notable family history of breast or ovarian cancer in her family.  The patient wears seatbelts: yes.   The patient has regular exercise: not asked.    The patient denies current symptoms of depression.    Review of Systems: Review of Systems  Constitutional:  Negative for chills and fever.  HENT:  Negative for congestion.   Respiratory:  Negative for cough and shortness of breath.   Cardiovascular:  Negative for chest pain and palpitations.  Gastrointestinal:  Negative for abdominal pain, constipation, diarrhea, heartburn, nausea and vomiting.  Genitourinary:  Negative for dysuria, frequency and urgency.  Skin:  Negative for itching and rash.  Neurological:  Negative for dizziness and headaches.  Endo/Heme/Allergies:  Negative for polydipsia.  Psychiatric/Behavioral:  Negative for depression.    Past Medical History:  There are no problems to display for this patient.   Past Surgical History:  Past Surgical History:  Procedure Laterality Date   TONSILLECTOMY      Gynecologic History:  Patient's last menstrual period was 04/05/2021. Contraception: oral progesterone-only contraceptive Last Pap: Results were: 8/20/2021NIL and HR HPV negative  Last mammogram: 04/01/2020 Results were: Gillian Shields I  Obstetric History: [id number]  Family History:  Family History  Problem Relation Age of  Onset   Bladder Cancer Maternal Grandfather    Lung cancer Paternal Grandmother    Lung cancer Paternal Grandfather    Breast cancer Paternal Aunt 53   Breast cancer Cousin     Social History:  Social History   Socioeconomic History   Marital status: Married    Spouse name: Not on file   Number of children: Not on file   Years of education: Not on file   Highest education level: Not on file  Occupational History   Not on file  Tobacco Use   Smoking status: Never   Smokeless tobacco: Never  Vaping Use   Vaping Use: Never used  Substance and Sexual Activity   Alcohol use: Yes    Alcohol/week: 0.0 standard drinks    Comment: occasional   Drug use: No   Sexual activity: Yes    Birth control/protection: Pill  Other Topics Concern   Not on file  Social History Narrative   Not on file   Social Determinants of Health   Financial Resource Strain: Not on file  Food Insecurity: Not on file  Transportation Needs: Not on file  Physical Activity: Not on file  Stress: Not on file  Social Connections: Not on file  Intimate Partner Violence: Not on file    Allergies:  No Known Allergies  Medications: Prior to Admission medications   Medication Sig Start Date End Date Taking? Authorizing Provider  Drospirenone (SLYND) 4 MG TABS Take 1 tablet by mouth daily. 05/27/20  Yes Malachy Mood, MD  Dulaglutide (TRULICITY) 6.23 JS/2.8BT SOPN Inject into the skin. 07/26/20  Yes [provider]  metFORMIN (GLUCOPHAGE) 500  MG tablet  03/03/19  Yes [provider]  Blood Glucose Monitoring Suppl (ONE TOUCH ULTRA 2) w/Device KIT AS DIRECTED 09/01/18   [provider]  glucose blood (PRECISION QID TEST) test strip Check fasting blood sugar once daily. Use as instructed. ONE TOUCH DX E11.9 07/30/18   [provider]  Lancets 30G MISC Use 1 each as directed (Dx E11.9) Check CBG's fasting once daily. Dx: E11.9 07/30/18   [provider]  lovastatin  (MEVACOR) 40 MG tablet  01/22/19   [provider]    Physical Exam Vitals: Blood pressure 118/74, height 5' 10"  (1.778 m), weight 212 lb (96.2 kg), last menstrual period 04/05/2021.  General: NAD HEENT: normocephalic, anicteric Thyroid: no enlargement, no palpable nodules Pulmonary: No increased work of breathing, CTAB Cardiovascular: RRR, distal pulses 2+ Breast: Breast symmetrical, no tenderness, no palpable nodules or masses, no skin or nipple retraction present, no nipple discharge.  No axillary or supraclavicular lymphadenopathy. Abdomen: NABS, soft, non-tender, non-distended.  Umbilicus without lesions.  No hepatomegaly, splenomegaly or masses palpable. No evidence of hernia  Genitourinary:  External: Normal external female genitalia.  Normal urethral meatus, normal Bartholin's and Skene's glands.    Vagina: Normal vaginal mucosa, no evidence of prolapse.    Cervix: Grossly normal in appearance, no bleeding  Uterus: Non-enlarged, mobile, normal contour.  No CMT  Adnexa: ovaries non-enlarged, no adnexal masses  Rectal: deferred  Lymphatic: no evidence of inguinal lymphadenopathy Extremities: no edema, erythema, or tenderness Neurologic: Grossly intact Psychiatric: mood appropriate, affect full  Female chaperone present for pelvic and breast  portions of the physical exam    Assessment: 40 y.o. [id number] routine annual exam  Plan: Problem List Items Addressed This Visit   None Visit Diagnoses     Missed period    -  Primary   Relevant Orders   POCT urine pregnancy (Completed)   Encounter for gynecological examination without abnormal finding       Screening for malignant neoplasm of cervix       Relevant Orders   Cytology - PAP   Breast screening       Relevant Orders   MM 3D SCREEN BREAST BILATERAL       1) Mammogram - recommend yearly screening mammogram.  Mammogram Was ordered today   2) STI screening  was notoffered and therefore not obtained  3)  ASCCP guidelines and rational discussed.  Patient opts for yearly screening interval  4) Contraception - the patient is currently using  oral progesterone-only contraceptive.  She is happy with her current form of contraception and plans to continue  5) Colonoscopy -- Screening recommended starting at age 32 for average risk individuals, age 44 for individuals deemed at increased risk (including African Americans) and recommended to continue until age 94.  For patient age 9-85 individualized approach is recommended.  Gold standard screening is via colonoscopy, Cologuard screening is an acceptable alternative for patient unwilling or unable to undergo colonoscopy.  "Colorectal cancer screening for average?risk adults: 2018 guideline update from the American Cancer Society"CA: A Cancer Journal for Clinicians: Mar 06, 2017   6) Routine healthcare maintenance including cholesterol, diabetes screening discussed managed by PCP  7) Return in about 1 year (around 05/29/2022) for annual (Give mobile mammogram info).   Malachy Mood, MD, [facility], [place] 05/29/2021, 8:39 AM

## 2021-06-01 DIAGNOSIS — Z862 Personal history of diseases of the blood and blood-forming organs and certain disorders involving the immune mechanism: Secondary | ICD-10-CM | POA: Insufficient documentation

## 2021-06-01 LAB — CYTOLOGY - PAP
Comment: NEGATIVE
Diagnosis: UNDETERMINED — AB
High risk HPV: NEGATIVE

## 2021-08-03 ENCOUNTER — Other Ambulatory Visit: Payer: Self-pay | Admitting: Obstetrics and Gynecology

## 2021-08-03 DIAGNOSIS — F331 Major depressive disorder, recurrent, moderate: Secondary | ICD-10-CM

## 2021-08-03 DIAGNOSIS — F322 Major depressive disorder, single episode, severe without psychotic features: Secondary | ICD-10-CM

## 2021-11-23 ENCOUNTER — Telehealth: Payer: Self-pay

## 2021-11-23 NOTE — Telephone Encounter (Signed)
Pt needs a mammogram order put in since AMS is no longer at [facility].

## 2021-11-23 NOTE — Telephone Encounter (Signed)
Pt left another msg on triage following up on mammo order request. Called pt back and advised msg has been routed to provider and we will get back to her as soon as we have order.

## 2021-11-24 ENCOUNTER — Other Ambulatory Visit: Payer: Self-pay | Admitting: Obstetrics and Gynecology

## 2021-11-24 DIAGNOSIS — Z1231 Encounter for screening mammogram for malignant neoplasm of breast: Secondary | ICD-10-CM

## 2021-11-24 NOTE — Telephone Encounter (Signed)
Order placed

## 2021-11-24 NOTE — Progress Notes (Signed)
Mammogram order placed

## 2021-11-24 NOTE — Telephone Encounter (Signed)
Contacted patient via phone. Patient aware to call to scheduled

## 2021-11-24 NOTE — Telephone Encounter (Signed)
Patient is calling to follow up on message left. Dr Gilman Schmidt could you please advise since [facility] is out of the office

## 2021-11-27 ENCOUNTER — Other Ambulatory Visit: Payer: Self-pay

## 2021-11-27 ENCOUNTER — Encounter: Payer: Self-pay | Admitting: Obstetrics

## 2021-11-27 ENCOUNTER — Other Ambulatory Visit: Payer: Self-pay | Admitting: Obstetrics

## 2021-11-27 ENCOUNTER — Ambulatory Visit: Payer: BC Managed Care – PPO | Admitting: Obstetrics

## 2021-11-27 VITALS — BP 126/84 | Ht 70.0 in | Wt 220.0 lb

## 2021-11-27 DIAGNOSIS — N63 Unspecified lump in unspecified breast: Secondary | ICD-10-CM | POA: Diagnosis not present

## 2021-11-27 DIAGNOSIS — N6320 Unspecified lump in the left breast, unspecified quadrant: Secondary | ICD-10-CM

## 2021-11-27 DIAGNOSIS — N6321 Unspecified lump in the left breast, upper outer quadrant: Secondary | ICD-10-CM

## 2021-11-27 NOTE — Progress Notes (Signed)
Obstetrics & Gynecology Office Visit   Chief Complaint:  Chief Complaint  Patient presents with   Breast Problem    Pt found lump in left breast    History of Present Illness: Jodi Salazar is a 41 yo  [facility] patient who presents with a concern regarding her left breast. She has noticed a change in the texture and feel of that breast. She has not had a mammogram this last year and is due. She is married, and shares that her husband has noted a change in the breast as well. She denies any nipple discharge. There is not a family history of breast cancer. She is a non smoker. She did not breastfeed her children.   Review of Systems:  Review of Systems  Constitutional: Negative.   HENT: Negative.    Eyes: Negative.   Respiratory: Negative.    Cardiovascular: Negative.   Gastrointestinal: Negative.   Genitourinary: Negative.   Musculoskeletal: Negative.   Skin: Negative.   Neurological: Negative.   Endo/Heme/Allergies: Negative.   Psychiatric/Behavioral: Negative.      Past Medical History:  Past Medical History:  Diagnosis Date   Diabetes mellitus without complication ([place])    History of gestational diabetes    Hyperlipidemia     Past Surgical History:  Past Surgical History:  Procedure Laterality Date   TONSILLECTOMY      Gynecologic History: Patient's last menstrual period was 11/21/2021 (exact date).  Obstetric History: [id number]  Family History:  Family History  Problem Relation Age of Onset   Bladder Cancer Maternal Grandfather    Lung cancer Paternal Grandmother    Lung cancer Paternal Grandfather    Breast cancer Paternal Aunt 28   Breast cancer Cousin     Social History:  Social History   Socioeconomic History   Marital status: Married    Spouse name: Not on file   Number of children: Not on file   Years of education: Not on file   Highest education level: Not on file  Occupational History   Not on file  Tobacco Use   Smoking status: Never    Smokeless tobacco: Never  Vaping Use   Vaping Use: Never used  Substance and Sexual Activity   Alcohol use: Yes    Alcohol/week: 0.0 standard drinks    Comment: occasional   Drug use: No   Sexual activity: Yes    Birth control/protection: Pill  Other Topics Concern   Not on file  Social History Narrative   Not on file   Social Determinants of Health   Financial Resource Strain: Not on file  Food Insecurity: Not on file  Transportation Needs: Not on file  Physical Activity: Not on file  Stress: Not on file  Social Connections: Not on file  Intimate Partner Violence: Not on file    Allergies:  No Known Allergies  Medications: Prior to Admission medications   Medication Sig Start Date End Date Taking? Authorizing Provider  Drospirenone (SLYND) 4 MG TABS Take 1 tablet by mouth daily. 05/29/21  Yes Malachy Mood, MD  Dulaglutide (TRULICITY) 1.55 MC/8.0EM SOPN Inject into the skin. 07/26/20  Yes [provider]  glucose blood (PRECISION QID TEST) test strip Check fasting blood sugar once daily. Use as instructed. ONE TOUCH DX E11.9 07/30/18  Yes [provider]  Lancets 30G MISC Use 1 each as directed (Dx E11.9) Check CBG's fasting once daily. Dx: E11.9 07/30/18  Yes [provider]  metFORMIN (GLUCOPHAGE) 500 MG tablet  03/03/19  Yes [provider]  Blood Glucose Monitoring Suppl (ONE TOUCH ULTRA 2) w/Device KIT AS DIRECTED Patient not taking: Reported on 11/27/2021 09/01/18   [provider]  lovastatin (MEVACOR) 40 MG tablet  01/22/19   [provider]    Physical Exam Vitals:  Vitals:   11/27/21 1423  BP: 126/84   Patient's last menstrual period was 11/21/2021 (exact date).  Physical Exam Constitutional:      Appearance: Normal appearance. She is obese.  HENT:     Head: Normocephalic and atraumatic.  Cardiovascular:     Rate and Rhythm: Normal rate and regular rhythm.  Pulmonary:     Effort: Pulmonary  effort is normal.     Breath sounds: Normal breath sounds.  Chest:     Chest wall: Mass present.  Breasts:    Right: Absent.     Left: Mass present.    Genitourinary:    Comments: Deferred. Musculoskeletal:        General: Normal range of motion.     Cervical back: Normal range of motion and neck supple.  Skin:    General: Skin is warm and dry.     Coloration: Skin is jaundiced.  Neurological:     General: No focal deficit present.     Mental Status: She is alert and oriented to person, place, and time. Mental status is at baseline.  Breast exam: Breasts are D cup siz, pendulous; nipples slightly everted. No nipple discharge noted bilaterally. The right breast is slightly fibrocystic with no palpable masses or nodules. The left breast has a thickened area of tissue in the upper outer quadrant that is not felt on her right breast. This area is palpated from approximately  12 to 3 o'clock  on her left breast.   Assessment: 40 y.o. [id number] with left breast mass on breast exam.  Plan: Problem List Items Addressed This Visit   None  We discussed the POC to include a diagnostic mammogram and possible ultrasound based on the mammo results. This is ordered for her today. She is encouraged to take her questions with her to the [facility] appointment. She is aware tha tthey may recommend a biplsy should anything seem suspicious.   F/U at [facility] PRN.  Imagene Riches, CNM  11/27/2021 5:22 PM

## 2021-12-08 ENCOUNTER — Ambulatory Visit
Admission: RE | Admit: 2021-12-08 | Discharge: 2021-12-08 | Disposition: A | Discharge status: Home / Self Care | Payer: BC Managed Care – PPO | Source: Ambulatory Visit | Attending: Obstetrics | Admitting: Obstetrics

## 2021-12-08 ENCOUNTER — Other Ambulatory Visit: Payer: Self-pay

## 2021-12-08 DIAGNOSIS — N63 Unspecified lump in unspecified breast: Secondary | ICD-10-CM

## 2021-12-13 ENCOUNTER — Other Ambulatory Visit: Payer: Self-pay | Admitting: Obstetrics

## 2021-12-13 ENCOUNTER — Encounter: Payer: Self-pay | Admitting: Obstetrics

## 2021-12-13 DIAGNOSIS — N6320 Unspecified lump in the left breast, unspecified quadrant: Secondary | ICD-10-CM

## 2021-12-18 ENCOUNTER — Encounter: Payer: Self-pay | Admitting: Obstetrics

## 2022-01-01 ENCOUNTER — Ambulatory Visit: Payer: Self-pay | Admitting: Surgery

## 2022-03-30 ENCOUNTER — Other Ambulatory Visit: Payer: Self-pay | Admitting: Surgery

## 2022-03-30 DIAGNOSIS — N644 Mastodynia: Secondary | ICD-10-CM

## 2022-05-01 ENCOUNTER — Other Ambulatory Visit: Payer: Self-pay | Admitting: Surgery

## 2022-05-01 ENCOUNTER — Ambulatory Visit
Admission: RE | Admit: 2022-05-01 | Discharge: 2022-05-01 | Disposition: A | Discharge status: Home / Self Care | Payer: BC Managed Care – PPO | Source: Ambulatory Visit | Attending: Surgery | Admitting: Surgery

## 2022-05-01 DIAGNOSIS — N644 Mastodynia: Secondary | ICD-10-CM

## 2022-05-01 DIAGNOSIS — R928 Other abnormal and inconclusive findings on diagnostic imaging of breast: Secondary | ICD-10-CM

## 2022-05-01 MED ORDER — GADOBUTROL 1 MMOL/ML IV SOLN
10.0000 mL | Freq: Once | INTRAVENOUS | Status: AC | PRN
  Administered 2022-05-01: 10 mL via INTRAVENOUS

## 2022-05-01 NOTE — Progress Notes (Signed)
Please call the patient and let them know that their MRI did not show any problems in the left breast.  However, the right breast has three areas that are questionable.  Radiology is recommending MR-guided biopsies of these three areas.  I will put those orders in.

## 2022-05-07 ENCOUNTER — Other Ambulatory Visit: Payer: Self-pay | Admitting: Surgery

## 2022-05-07 DIAGNOSIS — R928 Other abnormal and inconclusive findings on diagnostic imaging of breast: Secondary | ICD-10-CM

## 2022-05-11 ENCOUNTER — Ambulatory Visit
Admission: RE | Admit: 2022-05-11 | Discharge: 2022-05-11 | Disposition: A | Discharge status: Home / Self Care | Payer: BC Managed Care – PPO | Source: Ambulatory Visit | Attending: Surgery | Admitting: Surgery

## 2022-05-11 DIAGNOSIS — R928 Other abnormal and inconclusive findings on diagnostic imaging of breast: Secondary | ICD-10-CM

## 2022-05-11 MED ORDER — GADOBUTROL 1 MMOL/ML IV SOLN
10.0000 mL | Freq: Once | INTRAVENOUS | Status: AC | PRN
  Administered 2022-05-11: 10 mL via INTRAVENOUS

## 2022-05-17 ENCOUNTER — Other Ambulatory Visit: Payer: Self-pay | Admitting: Surgery

## 2022-05-17 DIAGNOSIS — R928 Other abnormal and inconclusive findings on diagnostic imaging of breast: Secondary | ICD-10-CM

## 2022-06-13 ENCOUNTER — Telehealth: Payer: Self-pay

## 2022-06-13 NOTE — Telephone Encounter (Signed)
Returned fax: Decline with note: Patient needs appointment. Annual past due as of 05/25/22.

## 2022-06-13 NOTE — Telephone Encounter (Signed)
Fax refill request received from [facility] for: Drospirenone (SLYND) 4 MG TABS  Last filled 03/23/2022   Qty: 84

## 2022-06-28 ENCOUNTER — Other Ambulatory Visit: Payer: Self-pay

## 2022-06-28 MED ORDER — SLYND 4 MG PO TABS: 1.0000 | ORAL_TABLET | Freq: Every day | ORAL | 0 refills | Status: DC

## 2022-07-19 ENCOUNTER — Other Ambulatory Visit (HOSPITAL_COMMUNITY)
Admission: RE | Admit: 2022-07-19 | Discharge: 2022-07-19 | Disposition: A | Discharge status: Home / Self Care | Payer: BC Managed Care – PPO | Source: Ambulatory Visit | Attending: Obstetrics | Admitting: Obstetrics

## 2022-07-19 ENCOUNTER — Ambulatory Visit (INDEPENDENT_AMBULATORY_CARE_PROVIDER_SITE_OTHER): Payer: BC Managed Care – PPO | Admitting: Obstetrics

## 2022-07-19 VITALS — BP 152/96 | Ht 70.0 in | Wt 220.0 lb

## 2022-07-19 DIAGNOSIS — Z3009 Encounter for other general counseling and advice on contraception: Secondary | ICD-10-CM | POA: Diagnosis not present

## 2022-07-19 DIAGNOSIS — Z01419 Encounter for gynecological examination (general) (routine) without abnormal findings: Secondary | ICD-10-CM

## 2022-07-19 DIAGNOSIS — Z124 Encounter for screening for malignant neoplasm of cervix: Secondary | ICD-10-CM | POA: Diagnosis present

## 2022-07-19 MED ORDER — SLYND 4 MG PO TABS: 1.0000 | ORAL_TABLET | Freq: Every day | ORAL | 0 refills | Status: DC

## 2022-07-19 NOTE — Progress Notes (Signed)
Gynecology Annual Exam  PCP: Dion Body, MD  Chief Complaint:  Chief Complaint  Patient presents with   Annual Exam    History of Present Illness: Patient is a 41 y.o. Y5X8335 presents for annual exam. The patient has no complaints today. She had an ASCUS pap last year. Her mammogram ordered earlier t/u in 6 months from August.  LMP: Patient's last menstrual period was 02/12/2022 (exact date). She generally does not get periods  , as she I takes Slynd, daily progesterone only .   The patient is sexually active. She currently uses oral progesterone-only contraceptive for contraception. She denies dyspareunia.  The patient does perform self breast exams.  There is notable family history of breast or ovarian cancer in her family, on her father's side. The breast specialist she has seen have not recommended Myriad testing.  The patient wears seatbelts: yes.   The patient has regular exercise: yes.    The patient denies current symptoms of depression.    Review of Systems: Review of Systems  Constitutional: Negative.   HENT: Negative.    Eyes: Negative.   Respiratory: Negative.    Cardiovascular: Negative.   Gastrointestinal: Negative.   Genitourinary: Negative.   Musculoskeletal: Negative.   Skin: Negative.   Neurological: Negative.   Endo/Heme/Allergies: Negative.   Psychiatric/Behavioral: Negative.      Past Medical History:  Patient Active Problem List   Diagnosis Date Noted   Breast lump or mass 11/27/2021    11/27/2021 work in appt at [facility]- left breast has increased "thickening of tissue from 12 to 3 o'clock Diagnostic mammo ordered.    History of normocytic normochromic anemia 06/01/2021   Class 1 obesity due to excess calories with serious comorbidity and body mass index (BMI) of 31.0 to 31.9 in adult 07/30/2018   Intractable migraine without aura and with status migrainosus 04/24/2018   Type 2 diabetes mellitus with hyperlipidemia ([place]) 02/08/2016    Pure hypercholesterolemia 02/08/2016    Past Surgical History:  Past Surgical History:  Procedure Laterality Date   TONSILLECTOMY      Gynecologic History:  Patient's last menstrual period was 02/12/2022 (exact date). Contraception: oral progesterone-only contraceptive Last Pap: Results were: ASCUS ASCUS with NEGATIVE high risk HPV  Last mammogram: 2023 Results were: BI-RADS IV  Obstetric History: O2P1898  Family History:  Family History  Problem Relation Age of Onset   Bladder Cancer Maternal Grandfather    Lung cancer Paternal Grandmother    Lung cancer Paternal Grandfather    Breast cancer Paternal Aunt 22   Breast cancer Cousin     Social History:  Social History   Socioeconomic History   Marital status: Married    Spouse name: Not on file   Number of children: Not on file   Years of education: Not on file   Highest education level: Not on file  Occupational History   Not on file  Tobacco Use   Smoking status: Never   Smokeless tobacco: Never  Vaping Use   Vaping Use: Never used  Substance and Sexual Activity   Alcohol use: Yes    Alcohol/week: 0.0 standard drinks of alcohol    Comment: occasional   Drug use: No   Sexual activity: Yes    Birth control/protection: Pill  Other Topics Concern   Not on file  Social History Narrative   Not on file   Social Determinants of Health   Financial Resource Strain: Not on file  Food Insecurity: Not on file  Transportation Needs: Not on file  Physical Activity: Sufficiently Active (03/13/2018)   Exercise Vital Sign    Days of Exercise per Week: 4 days    Minutes of Exercise per Session: 60 min  Stress: Not on file  Social Connections: Not on file  Intimate Partner Violence: Not on file    Allergies:  No Known Allergies  Medications: Prior to Admission medications   Medication Sig Start Date End Date Taking? Authorizing Provider  Blood Glucose Monitoring Suppl (ONE TOUCH ULTRA 2) w/Device KIT  09/01/18   Yes [provider]  Drospirenone (SLYND) 4 MG TABS Take 1 tablet by mouth daily. 06/28/22  Yes Imagene Riches, CNM  Dulaglutide (TRULICITY) 0.01 VC/9.4WH SOPN Inject into the skin. 07/26/20  Yes [provider]  glucose blood (PRECISION QID TEST) test strip Check fasting blood sugar once daily. Use as instructed. ONE TOUCH DX E11.9 07/30/18  Yes [provider]  Lancets 30G MISC Use 1 each as directed (Dx E11.9) Check CBG's fasting once daily. Dx: E11.9 07/30/18  Yes [provider]  lovastatin (MEVACOR) 40 MG tablet  01/22/19  Yes [provider]  metFORMIN (GLUCOPHAGE) 500 MG tablet  03/03/19  Yes [provider]    Physical Exam Vitals: Blood pressure (!) 152/96, height 5' 10"  (1.778 m), weight 99.8 kg, last menstrual period 02/12/2022.  General: NAD HEENT: normocephalic, anicteric Thyroid: no enlargement, no palpable nodules Pulmonary: No increased work of breathing, CTAB Cardiovascular: RRR, distal pulses 2+ Breast: Breast symmetrical, slight tenderness on the upper outer quad of the right breast- fibrogalndular feeling tissue noted there, no palpable nodules or masses, no skin or nipple retraction present, no nipple discharge.  No axillary or supraclavicular lymphadenopathy. Abdomen: NABS, soft, non-tender, non-distended.  Umbilicus without lesions.  No hepatomegaly, splenomegaly or masses palpable. No evidence of hernia  Genitourinary:  External: Normal external female genitalia.  Normal urethral meatus, normal Bartholin's and Skene's glands.    Vagina: Normal vaginal mucosa, no evidence of prolapse.    Cervix: Grossly normal in appearance, no bleeding  Uterus: Non-enlarged, mobile, normal contour.  No CMT  Adnexa: ovaries non-enlarged, no adnexal masses  Rectal: deferred  Lymphatic: no evidence of inguinal lymphadenopathy Extremities: no edema, erythema, or tenderness Neurologic: Grossly intact Psychiatric: mood appropriate,  affect full  Female chaperone present for pelvic and breast  portions of the physical exam    Assessment: 41 y.o. [id number] routine annual exam  Plan: Problem List Items Addressed This Visit   None Visit Diagnoses     Cervical cancer screening    -  Primary   Relevant Orders   Cytology - PAP       1) Mammogram - recommend yearly screening mammogram.  Mammogram Is up to date she is being followed  for her RAD4 mammograms and will have diagnostic mammograms now.   2) STI screening  wasoffered and declined  3) ASCCP guidelines and rational discussed.  Patient opts for every 3 years screening interval  4) Contraception - the patient is currently using  oral progesterone-only contraceptive.  She is happy with her current form of contraception and plans to continue  5) Colonoscopy -- Screening recommended starting at age 63 for average risk individuals, age 27 for individuals deemed at increased risk (including African Americans) and recommended to continue until age 95.  For patient age 35-85 individualized approach is recommended.  Gold standard screening is via colonoscopy, Cologuard screening is an acceptable alternative for patient unwilling or unable to undergo  colonoscopy.  "Colorectal cancer screening for average?risk adults: 2018 guideline update from the American Cancer Society"CA: A Cancer Journal for Clinicians: Mar 06, 2017   6) Routine healthcare maintenance including cholesterol, diabetes screening discussed managed by PCP  7) No follow-ups on file. We discussed the option of having BRCA, or Myriad testing. She will reach out should she desire to be screened for eligibility.   Imagene Riches, CNM  07/19/2022 9:32 AM   [facility], [facility] 07/19/2022, 9:31 AM

## 2022-07-19 NOTE — Addendum Note (Signed)
Addended by: Imagene Riches on: 07/19/2022 10:02 AM   Modules accepted: Orders

## 2022-07-23 LAB — CYTOLOGY - PAP
Comment: NEGATIVE
Diagnosis: UNDETERMINED — AB
High risk HPV: NEGATIVE

## 2022-07-24 ENCOUNTER — Other Ambulatory Visit: Payer: Self-pay | Admitting: Obstetrics

## 2022-07-24 NOTE — Progress Notes (Signed)
metronidazole 

## 2022-07-25 ENCOUNTER — Other Ambulatory Visit: Payer: Self-pay | Admitting: Obstetrics

## 2022-07-25 ENCOUNTER — Encounter: Payer: Self-pay | Admitting: Obstetrics

## 2022-07-25 DIAGNOSIS — B9689 Other specified bacterial agents as the cause of diseases classified elsewhere: Secondary | ICD-10-CM

## 2022-07-25 MED ORDER — METRONIDAZOLE 0.75 % EX GEL: Freq: Every day | CUTANEOUS | Status: AC

## 2022-08-03 ENCOUNTER — Encounter: Payer: Self-pay | Admitting: Obstetrics

## 2022-08-03 ENCOUNTER — Other Ambulatory Visit: Payer: Self-pay

## 2022-08-08 ENCOUNTER — Encounter: Payer: Self-pay | Admitting: Obstetrics

## 2022-08-10 ENCOUNTER — Other Ambulatory Visit: Payer: Self-pay | Admitting: Obstetrics

## 2022-08-10 DIAGNOSIS — B9689 Other specified bacterial agents as the cause of diseases classified elsewhere: Secondary | ICD-10-CM

## 2022-08-10 MED ORDER — METRONIDAZOLE 500 MG PO TABS: 500.0000 mg | ORAL_TABLET | Freq: Two times a day (BID) | ORAL | 0 refills | Status: AC

## 2022-08-10 NOTE — Progress Notes (Signed)
I called this patient who contacted [place] as her PCP told her that with the latest pap being ASCUS, that she needed another pap in 6 months. She was diagnosed with BV when she had the pap done. And after calling her today, she admits that she never received the flagyl, so has nto treated. I explained that we would repeat the pap in one year, and that she should take the Metronidazole. I have resent a RX for her, and she will complete it. She has also note iced one episode of nipple discharge, and in light of her having abnormal mammograms with biopsy this year, is urged to contact the breast surgeon to discuss this. She already has an appointment for further breast studies in the spring.  Imagene Riches, CNM  08/10/2022 1:00 PM

## 2022-11-20 ENCOUNTER — Other Ambulatory Visit: Payer: Self-pay | Admitting: Surgery

## 2022-11-20 ENCOUNTER — Ambulatory Visit
Admission: RE | Admit: 2022-11-20 | Discharge: 2022-11-20 | Disposition: A | Discharge status: Home / Self Care | Payer: BC Managed Care – PPO | Source: Ambulatory Visit | Attending: Surgery | Admitting: Surgery

## 2022-11-20 DIAGNOSIS — R928 Other abnormal and inconclusive findings on diagnostic imaging of breast: Secondary | ICD-10-CM

## 2022-11-20 MED ORDER — GADOPICLENOL 0.5 MMOL/ML IV SOLN
10.0000 mL | Freq: Once | INTRAVENOUS | Status: AC | PRN
  Administered 2022-11-20: 10 mL via INTRAVENOUS

## 2022-11-20 NOTE — Progress Notes (Signed)
Please call the patient and let them know that their MRI showed a couple of new suspicious areas on the right.  The previous areas seem smaller or are gone completely.  Radiology is recommending biopsy of one of the new areas.  I've placed the order in Epic.

## 2022-11-22 ENCOUNTER — Other Ambulatory Visit: Payer: Self-pay | Admitting: Surgery

## 2022-11-22 DIAGNOSIS — R928 Other abnormal and inconclusive findings on diagnostic imaging of breast: Secondary | ICD-10-CM

## 2022-12-10 ENCOUNTER — Ambulatory Visit
Admission: RE | Admit: 2022-12-10 | Discharge: 2022-12-10 | Disposition: A | Discharge status: Home / Self Care | Payer: BC Managed Care – PPO | Source: Ambulatory Visit | Attending: Surgery | Admitting: Surgery

## 2022-12-10 ENCOUNTER — Other Ambulatory Visit (HOSPITAL_COMMUNITY): Payer: Self-pay | Admitting: Diagnostic Radiology

## 2022-12-10 ENCOUNTER — Other Ambulatory Visit: Payer: Self-pay | Admitting: Surgery

## 2022-12-10 DIAGNOSIS — R928 Other abnormal and inconclusive findings on diagnostic imaging of breast: Secondary | ICD-10-CM

## 2022-12-10 MED ORDER — GADOPICLENOL 0.5 MMOL/ML IV SOLN
10.0000 mL | Freq: Once | INTRAVENOUS | Status: AC | PRN
  Administered 2022-12-10: 10 mL via INTRAVENOUS

## 2022-12-11 ENCOUNTER — Other Ambulatory Visit: Payer: Self-pay | Admitting: Obstetrics

## 2022-12-11 DIAGNOSIS — Z3009 Encounter for other general counseling and advice on contraception: Secondary | ICD-10-CM

## 2022-12-17 ENCOUNTER — Other Ambulatory Visit: Payer: Self-pay | Admitting: Obstetrics

## 2022-12-17 DIAGNOSIS — Z3009 Encounter for other general counseling and advice on contraception: Secondary | ICD-10-CM

## 2022-12-19 ENCOUNTER — Other Ambulatory Visit: Payer: Self-pay | Admitting: Obstetrics

## 2022-12-19 DIAGNOSIS — Z3009 Encounter for other general counseling and advice on contraception: Secondary | ICD-10-CM

## 2022-12-23 ENCOUNTER — Other Ambulatory Visit: Payer: Self-pay | Admitting: Obstetrics

## 2022-12-23 DIAGNOSIS — Z3009 Encounter for other general counseling and advice on contraception: Secondary | ICD-10-CM

## 2022-12-23 MED ORDER — SLYND 4 MG PO TABS: 1.0000 | ORAL_TABLET | Freq: Every day | ORAL | 0 refills | Status: DC

## 2022-12-23 NOTE — Progress Notes (Signed)
Patient is due for her Annual [facility] physical. She is requesting a renewal of her POPs (Slynd) I have asked the staff to call her and make her appointment. In the meantime I am renewing her Rx for two months.  Imagene Riches, CNM  12/23/2022 9:58 PM

## 2022-12-31 ENCOUNTER — Other Ambulatory Visit: Payer: Self-pay

## 2022-12-31 DIAGNOSIS — Z3009 Encounter for other general counseling and advice on contraception: Secondary | ICD-10-CM

## 2022-12-31 MED ORDER — SLYND 4 MG PO TABS: 1.0000 | ORAL_TABLET | Freq: Every day | ORAL | 0 refills | Status: DC

## 2022-12-31 NOTE — Telephone Encounter (Signed)
Pt is scheduled for her annual on 02/01/23 with [facility].

## 2022-12-31 NOTE — Telephone Encounter (Signed)
Pt called triage needing her [facility] sent to a different Pharmacy due to the discount card being hacked and her BC ending up costing 400 dollars. BC sent to [facility].

## 2023-01-08 ENCOUNTER — Telehealth: Payer: Self-pay | Admitting: Obstetrics

## 2023-01-08 NOTE — Telephone Encounter (Signed)
Left message for patient in regards to her upcoming appointment on 01/31/23 with MMF being moved to 02/01/23. [facility] message sent

## 2023-01-10 NOTE — Telephone Encounter (Signed)
The patient is aware of scheduled change via my chart

## 2023-01-25 ENCOUNTER — Ambulatory Visit
Admission: RE | Admit: 2023-01-25 | Discharge: 2023-01-25 | Disposition: A | Discharge status: Home / Self Care | Payer: BC Managed Care – PPO | Source: Ambulatory Visit | Attending: Emergency Medicine | Admitting: Emergency Medicine

## 2023-01-25 ENCOUNTER — Ambulatory Visit (INDEPENDENT_AMBULATORY_CARE_PROVIDER_SITE_OTHER): Payer: BC Managed Care – PPO

## 2023-01-25 VITALS — BP 122/80 | HR 96 | Temp 97.9°F | Resp 18

## 2023-01-25 DIAGNOSIS — M25532 Pain in left wrist: Secondary | ICD-10-CM | POA: Diagnosis not present

## 2023-01-25 DIAGNOSIS — M79632 Pain in left forearm: Secondary | ICD-10-CM | POA: Diagnosis not present

## 2023-01-25 DIAGNOSIS — S5012XA Contusion of left forearm, initial encounter: Secondary | ICD-10-CM

## 2023-01-25 DIAGNOSIS — M25539 Pain in unspecified wrist: Secondary | ICD-10-CM | POA: Insufficient documentation

## 2023-01-25 NOTE — Progress Notes (Deleted)
ANNUAL PREVENTATIVE CARE GYNECOLOGY  ENCOUNTER NOTE  Subjective:       Jodi Salazar is a 42 y.o. [id number] female here for a routine annual gynecologic exam. The patient {is/is not/has never been:13135} sexually active. The patient {is/is not:13135} taking hormone replacement therapy. {post-men bleed:13152::"Patient denies post-menopausal vaginal bleeding."} The patient wears seatbelts: {yes/no:311178}. The patient participates in regular exercise: {yes/no/not asked:9010}. Has the patient ever been transfused or tattooed?: {yes/no/not asked:9010}. The patient reports that there {is/is not:9024} domestic violence in her life.  Current complaints: 1.  ***    Gynecologic History No LMP recorded. (Menstrual status: Oral contraceptives). Contraception: {method:5051} Last Pap: 07/19/22. Results were: abnormal Atypical squamous cells of undetermined significance (ASC-US) Abnormal   Last mammogram: 11/20/22. Results were: abnormal Scattered fibroglandular tissue  Last Colonoscopy:  Last Dexa Scan:    Obstetric History OB History  Gravida Para Term Preterm AB Living  SAB IAB Ectopic Multiple Live Births  1       4    # Outcome Date GA Lbr Len/2nd Weight Sex Delivery Anes PTL Lv  5 Term 09/04/13   8 lb 14 oz (4.026 kg) M Vag-Spont   LIV  4 Term 08/09/11   8 lb 6 oz (3.799 kg) F Vag-Spont   LIV  3 Term 06/09/10   7 lb 14 oz (3.572 kg) M Vag-Spont   LIV  2 Term 07/08/05   9 lb (4.082 kg) F Vag-Spont   LIV  1 SAB             Past Medical History:  Diagnosis Date   Diabetes mellitus without complication (HCC)    History of gestational diabetes    Hyperlipidemia     Family History  Problem Relation Age of Onset   Bladder Cancer Maternal Grandfather    Lung cancer Paternal Grandmother    Lung cancer Paternal Grandfather    Breast cancer Paternal Aunt 88   Breast cancer Cousin     Past Surgical History:  Procedure Laterality Date    TONSILLECTOMY      Social History   Socioeconomic History   Marital status: Married    Spouse name: Not on file   Number of children: Not on file   Years of education: Not on file   Highest education level: Not on file  Occupational History   Not on file  Tobacco Use   Smoking status: Never   Smokeless tobacco: Never  Vaping Use   Vaping Use: Never used  Substance and Sexual Activity   Alcohol use: Yes    Alcohol/week: 0.0 standard drinks of alcohol    Comment: occasional   Drug use: No   Sexual activity: Yes    Birth control/protection: Pill  Other Topics Concern   Not on file  Social History Narrative   Not on file   Social Determinants of Health   Financial Resource Strain: Not on file  Food Insecurity: Not on file  Transportation Needs: Not on file  Physical Activity: Sufficiently Active (03/13/2018)   Exercise Vital Sign    Days of Exercise per Week: 4 days    Minutes of Exercise per Session: 60 min  Stress: Not on file  Social Connections: Not on file  Intimate Partner Violence: Not on file    Current Outpatient Medications on File Prior to Visit  Medication Sig Dispense Refill  Blood Glucose Monitoring Suppl (ONE TOUCH ULTRA 2) w/Device KIT      Drospirenone (SLYND) 4 MG TABS Take 1 tablet (4 mg total) by mouth daily. 60 tablet 0   Dulaglutide (TRULICITY) 0.75 MG/0.5ML SOPN Inject into the skin.     glucose blood (PRECISION QID TEST) test strip Check fasting blood sugar once daily. Use as instructed. ONE TOUCH DX E11.9     Lancets 30G MISC Use 1 each as directed (Dx E11.9) Check CBG's fasting once daily. Dx: E11.9     lovastatin (MEVACOR) 40 MG tablet      metFORMIN (GLUCOPHAGE) 500 MG tablet      Current Facility-Administered Medications on File Prior to Visit  Medication Dose Route Frequency Provider Last Rate Last Admin   metroNIDAZOLE (METROGEL) 0.75 % gel   Topical QHS Mirna Mires, CNM        No Known Allergies    Review of  Systems ROS Review of Systems - General ROS: negative for - chills, fatigue, fever, hot flashes, night sweats, weight gain or weight loss Psychological ROS: negative for - anxiety, decreased libido, depression, mood swings, physical abuse or sexual abuse Ophthalmic ROS: negative for - blurry vision, eye pain or loss of vision ENT ROS: negative for - headaches, hearing change, visual changes or vocal changes Allergy and Immunology ROS: negative for - hives, itchy/watery eyes or seasonal allergies Hematological and Lymphatic ROS: negative for - bleeding problems, bruising, swollen lymph nodes or weight loss Endocrine ROS: negative for - galactorrhea, hair pattern changes, hot flashes, malaise/lethargy, mood swings, palpitations, polydipsia/polyuria, skin changes, temperature intolerance or unexpected weight changes Breast ROS: negative for - new or changing breast lumps or nipple discharge Respiratory ROS: negative for - cough or shortness of breath Cardiovascular ROS: negative for - chest pain, irregular heartbeat, palpitations or shortness of breath Gastrointestinal ROS: no abdominal pain, change in bowel habits, or black or bloody stools Genito-Urinary ROS: no dysuria, trouble voiding, or hematuria Musculoskeletal ROS: negative for - joint pain or joint stiffness Neurological ROS: negative for - bowel and bladder control changes Dermatological ROS: negative for rash and skin lesion changes   Objective:   There were no vitals taken for this visit. CONSTITUTIONAL: Well-developed, well-nourished female in no acute distress.  PSYCHIATRIC: Normal mood and affect. Normal behavior. Normal judgment and thought content. NEUROLGIC: Alert and oriented to person, place, and time. Normal muscle tone coordination. No cranial nerve deficit noted. HENT:  Normocephalic, atraumatic, External right and left ear normal. Oropharynx is clear and moist EYES: Conjunctivae and EOM are normal. Pupils are equal,  round, and reactive to light. No scleral icterus.  NECK: Normal range of motion, supple, no masses.  Normal thyroid.  SKIN: Skin is warm and dry. No rash noted. Not diaphoretic. No erythema. No pallor. CARDIOVASCULAR: Normal heart rate noted, regular rhythm, no murmur. RESPIRATORY: Clear to auscultation bilaterally. Effort and breath sounds normal, no problems with respiration noted. BREASTS: Symmetric in size. No masses, skin changes, nipple drainage, or lymphadenopathy. ABDOMEN: Soft, normal bowel sounds, no distention noted.  No tenderness, rebound or guarding.  BLADDER: Normal PELVIC:  Bladder {:311640}  Urethra: {:311719}  Vulva: {:311722}  Vagina: {:311643}  Cervix: {:311644}  Uterus: {:311718}  Adnexa: {:311645}  RV: {Blank multiple:19196::"External Exam NormaI","No Rectal Masses","Normal Sphincter tone"}  MUSCULOSKELETAL: Normal range of motion. No tenderness.  No cyanosis, clubbing, or edema.  2+ distal pulses. LYMPHATIC: No Axillary, Supraclavicular, or Inguinal Adenopathy.   Labs: Lab Results  Component Value Date  WBC 8.7 04/19/2018   HGB 13.5 04/19/2018   HCT 39.6 04/19/2018   MCV 86.8 04/19/2018   PLT 260 04/19/2018    Lab Results  Component Value Date   CREATININE 0.65 04/19/2018   BUN 11 04/19/2018   NA 138 04/19/2018   K 3.8 04/19/2018   CL 101 04/19/2018   CO2 26 04/19/2018    Lab Results  Component Value Date   ALT 17 11/19/2012   AST 18 11/19/2012   ALKPHOS 116 11/19/2012   BILITOT 0.3 11/19/2012    No results found for: "CHOL", "HDL", "LDLCALC", "LDLDIRECT", "TRIG", "CHOLHDL"  No results found for: "TSH"  No results found for: "HGBA1C"   Assessment:   1. Women's annual routine gynecological examination      Plan:  Pap: {Blank multiple:19196::"Pap, Reflex if ASCUS","Pap Co Test","GC/CT NAAT","Not needed","Not done"} Mammogram: {Blank multiple:19196::"***","Ordered","Not Ordered","Not Indicated"} Colon Screening:  {Blank  multiple:19196::"***","Ordered","Not Ordered","Not Indicated"} Labs: {Blank multiple:19196::"Lipid 1","FBS","TSH","Hemoglobin A1C","Vit D Level""***"} Routine preventative health maintenance measures emphasized: {Blank multiple:19196::"Exercise/Diet/Weight control","Tobacco Warnings","Alcohol/Substance use risks","Stress Management","Peer Pressure Issues","Safe Sex"} COVID Vaccination status: Return to Clinic - 1 Year   Paula Compton CNM [facility]

## 2023-01-25 NOTE — ED Provider Notes (Signed)
Renaldo Fiddler    CSN: [id number] Arrival date & time: 01/25/23  1632      History   Chief Complaint Chief Complaint  Patient presents with   Arm Injury    Entered by patient    HPI Nikolette Reindl is a 42 y.o. female.  Patient presents with left forearm pain, swelling, bruising since she tripped and fell this morning.  She tripped over her cat and fell striking the side of her forearm on the wall.  No open wounds, numbness, weakness, or other symptoms.  No treatments at home.  Her medical history includes diabetes, hyperlipidemia, obesity, migraine headache.  The history is provided by the patient and medical records.    Past Medical History:  Diagnosis Date   Diabetes mellitus without complication    History of gestational diabetes    Hyperlipidemia     Patient Active Problem List   Diagnosis Date Noted   Pain in joint, forearm 01/25/2023   Breast lump or mass 11/27/2021   History of normocytic normochromic anemia 06/01/2021   Class 1 obesity due to excess calories with serious comorbidity and body mass index (BMI) of 31.0 to 31.9 in adult 07/30/2018   Intractable migraine without aura and with status migrainosus 04/24/2018   Type 2 diabetes mellitus with hyperlipidemia 02/08/2016   Pure hypercholesterolemia 02/08/2016    Past Surgical History:  Procedure Laterality Date   TONSILLECTOMY      OB History     Gravida  5   Para  4   Term  4   Preterm      AB  1   Living  4      SAB  1   IAB      Ectopic      Multiple      Live Births  4            Home Medications    Prior to Admission medications   Medication Sig Start Date End Date Taking? Authorizing Provider  Blood Glucose Monitoring Suppl (ONE TOUCH ULTRA 2) w/Device KIT  09/01/18   [provider]  Drospirenone (SLYND) 4 MG TABS Take 1 tablet (4 mg total) by mouth daily. 12/31/22   Mirna Mires, CNM  Dulaglutide (TRULICITY) 0.75 MG/0.5ML SOPN Inject into the  skin. 07/26/20   [provider]  glucose blood (PRECISION QID TEST) test strip Check fasting blood sugar once daily. Use as instructed. ONE TOUCH DX E11.9 07/30/18   [provider]  Lancets 30G MISC Use 1 each as directed (Dx E11.9) Check CBG's fasting once daily. Dx: E11.9 07/30/18   [provider]  lovastatin (MEVACOR) 40 MG tablet  01/22/19   [provider]  metFORMIN (GLUCOPHAGE) 500 MG tablet  03/03/19   [provider]    Family History Family History  Problem Relation Age of Onset   Bladder Cancer Maternal Grandfather    Lung cancer Paternal Grandmother    Lung cancer Paternal Grandfather    Breast cancer Paternal Aunt 82   Breast cancer Cousin     Social History Social History   Tobacco Use   Smoking status: Never   Smokeless tobacco: Never  Vaping Use   Vaping Use: Never used  Substance Use Topics   Alcohol use: Yes    Alcohol/week: 0.0 standard drinks of alcohol    Comment: occasional   Drug use: No     Allergies   Patient has no known allergies.   Review  of Systems Review of Systems  Constitutional:  Negative for chills and fever.  Musculoskeletal:  Negative for arthralgias and joint swelling.       Left forearm pain, swelling, bruising.  Skin:  Positive for color change. Negative for wound.  Neurological:  Negative for weakness and numbness.     Physical Exam Triage Vital Signs ED Triage Vitals  Enc Vitals Group     BP      Pulse      Resp      Temp      Temp src      SpO2      Weight      Height      Head Circumference      Peak Flow      Pain Score      Pain Loc      Pain Edu?      Excl. in GC?    No data found.  Updated Vital Signs BP 122/80   Pulse 96   Temp 97.9 F (36.6 C)   Resp 18   LMP 01/09/2023   SpO2 99%   Visual Acuity Right Eye Distance:   Left Eye Distance:   Bilateral Distance:    Right Eye Near:   Left Eye Near:    Bilateral Near:     Physical Exam Vitals  and nursing note reviewed.  Constitutional:      General: She is not in acute distress.    Appearance: She is well-developed. She is not ill-appearing.  HENT:     Mouth/Throat:     Mouth: Mucous membranes are moist.  Cardiovascular:     Rate and Rhythm: Normal rate and regular rhythm.     Heart sounds: Normal heart sounds.  Pulmonary:     Effort: Pulmonary effort is normal. No respiratory distress.     Breath sounds: Normal breath sounds.  Musculoskeletal:        General: Swelling and tenderness present. No deformity. Normal range of motion.       Arms:     Cervical back: Neck supple.  Skin:    General: Skin is warm and dry.     Capillary Refill: Capillary refill takes less than 2 seconds.     Findings: Bruising present. No lesion.  Neurological:     General: No focal deficit present.     Mental Status: She is alert and oriented to person, place, and time.     Sensory: No sensory deficit.     Motor: No weakness.  Psychiatric:        Mood and Affect: Mood normal.        Behavior: Behavior normal.      UC Treatments / Results  Labs (all labs ordered are listed, but only abnormal results are displayed) Labs Reviewed - No data to display  EKG   Radiology DG Forearm Left  Result Date: 01/25/2023 CLINICAL DATA:  Ground level fall EXAM: LEFT FOREARM - 2 VIEW COMPARISON:  None Available. FINDINGS: There is no evidence of fracture or dislocation. Soft tissues are unremarkable. IMPRESSION: Negative. Electronically Signed   By: Wiliam Ke M.D.   On: 01/25/2023 17:14    Procedures Procedures (including critical care time)  Medications Ordered in UC Medications - No data to display  Initial Impression / Assessment and Plan / UC Course  I have reviewed the triage vital signs and the nursing notes.  Pertinent labs & imaging results that were available during my care of the  patient were reviewed by me and considered in my medical decision making (see chart for details).    Pain and contusion of left forearm.  X-ray negative.  Treating with Tylenol or ibuprofen, rest, elevation, ice packs.  Instructed patient to follow-up with her PCP or an orthopedist if her symptoms are not improving.  Contact information for on-call Ortho provided.  Patient agrees to plan of care.   Final Clinical Impressions(s) / UC Diagnoses   Final diagnoses:  Contusion of left forearm, initial encounter  Pain of left forearm     Discharge Instructions      The xray is normal.    Take Tylenol or ibuprofen as directed.  Rest and elevate your arm.  Apply ice packs 2-3 times a day for up to 15 minutes each.      Follow up with an orthopedist if your symptoms are not improving.        ED Prescriptions   None    PDMP not reviewed this encounter.   Mickie Bail, NP 01/25/23 1723

## 2023-01-25 NOTE — Discharge Instructions (Addendum)
The xray is normal.    Take Tylenol or ibuprofen as directed.  Rest and elevate your arm.  Apply ice packs 2-3 times a day for up to 15 minutes each.      Follow up with an orthopedist if your symptoms are not improving.

## 2023-01-25 NOTE — ED Triage Notes (Signed)
Patient to Urgent Care with complaints of  left sided forearm pain following a ground level fall this morning. Patient with bruising and swelling.   Denies any hx of injury.

## 2023-01-31 ENCOUNTER — Ambulatory Visit: Payer: BC Managed Care – PPO | Admitting: Obstetrics

## 2023-02-01 ENCOUNTER — Ambulatory Visit: Payer: BC Managed Care – PPO | Admitting: Obstetrics

## 2023-02-01 DIAGNOSIS — Z01419 Encounter for gynecological examination (general) (routine) without abnormal findings: Secondary | ICD-10-CM

## 2023-02-01 DIAGNOSIS — Z124 Encounter for screening for malignant neoplasm of cervix: Secondary | ICD-10-CM

## 2023-03-12 ENCOUNTER — Other Ambulatory Visit: Payer: Self-pay | Admitting: Obstetrics

## 2023-03-12 DIAGNOSIS — Z1231 Encounter for screening mammogram for malignant neoplasm of breast: Secondary | ICD-10-CM

## 2023-03-24 ENCOUNTER — Other Ambulatory Visit: Payer: Self-pay | Admitting: Obstetrics

## 2023-03-24 DIAGNOSIS — Z3009 Encounter for other general counseling and advice on contraception: Secondary | ICD-10-CM

## 2023-04-02 ENCOUNTER — Ambulatory Visit
Admission: RE | Admit: 2023-04-02 | Discharge: 2023-04-02 | Disposition: A | Discharge status: Home / Self Care | Payer: BC Managed Care – PPO | Source: Ambulatory Visit | Attending: Obstetrics | Admitting: Obstetrics

## 2023-04-02 DIAGNOSIS — Z1231 Encounter for screening mammogram for malignant neoplasm of breast: Secondary | ICD-10-CM | POA: Insufficient documentation

## 2023-05-02 ENCOUNTER — Other Ambulatory Visit: Payer: Self-pay | Admitting: Surgery

## 2023-05-02 DIAGNOSIS — R928 Other abnormal and inconclusive findings on diagnostic imaging of breast: Secondary | ICD-10-CM

## 2023-06-11 ENCOUNTER — Encounter: Payer: Self-pay | Admitting: Surgery

## 2023-06-14 ENCOUNTER — Ambulatory Visit
Admission: RE | Admit: 2023-06-14 | Discharge: 2023-06-14 | Disposition: A | Discharge status: Home / Self Care | Payer: BC Managed Care – PPO | Source: Ambulatory Visit | Attending: Surgery | Admitting: Surgery

## 2023-06-14 DIAGNOSIS — R928 Other abnormal and inconclusive findings on diagnostic imaging of breast: Secondary | ICD-10-CM

## 2023-06-14 MED ORDER — GADOPICLENOL 0.5 MMOL/ML IV SOLN
10.0000 mL | Freq: Once | INTRAVENOUS | Status: AC | PRN
  Administered 2023-06-14: 10 mL via INTRAVENOUS

## 2023-07-09 NOTE — Progress Notes (Signed)
Please call the patient and let them know that their breast MRI appeared normal

## 2024-01-30 ENCOUNTER — Ambulatory Visit (INDEPENDENT_AMBULATORY_CARE_PROVIDER_SITE_OTHER)

## 2024-01-30 ENCOUNTER — Other Ambulatory Visit (HOSPITAL_COMMUNITY)
Admission: RE | Admit: 2024-01-30 | Discharge: 2024-01-30 | Disposition: A | Discharge status: Home / Self Care | Source: Ambulatory Visit

## 2024-01-30 VITALS — BP 135/89 | HR 92 | Ht 70.0 in | Wt 230.0 lb

## 2024-01-30 DIAGNOSIS — Z01419 Encounter for gynecological examination (general) (routine) without abnormal findings: Secondary | ICD-10-CM | POA: Diagnosis not present

## 2024-01-30 DIAGNOSIS — Z124 Encounter for screening for malignant neoplasm of cervix: Secondary | ICD-10-CM | POA: Diagnosis present

## 2024-01-30 DIAGNOSIS — Z113 Encounter for screening for infections with a predominantly sexual mode of transmission: Secondary | ICD-10-CM | POA: Diagnosis not present

## 2024-01-30 DIAGNOSIS — N6321 Unspecified lump in the left breast, upper outer quadrant: Secondary | ICD-10-CM

## 2024-01-30 DIAGNOSIS — Z3041 Encounter for surveillance of contraceptive pills: Secondary | ICD-10-CM

## 2024-01-30 DIAGNOSIS — E7800 Pure hypercholesterolemia, unspecified: Secondary | ICD-10-CM

## 2024-01-30 DIAGNOSIS — N393 Stress incontinence (female) (male): Secondary | ICD-10-CM

## 2024-01-30 DIAGNOSIS — E1169 Type 2 diabetes mellitus with other specified complication: Secondary | ICD-10-CM

## 2024-01-30 DIAGNOSIS — E6609 Other obesity due to excess calories: Secondary | ICD-10-CM

## 2024-01-30 DIAGNOSIS — Z1231 Encounter for screening mammogram for malignant neoplasm of breast: Secondary | ICD-10-CM

## 2024-01-30 MED ORDER — SLYND 4 MG PO TABS: 1.0000 | ORAL_TABLET | Freq: Every day | ORAL | 12 refills | Status: AC

## 2024-01-30 NOTE — Assessment & Plan Note (Signed)
-   Reviewed health maintenance topics as documented below. - Most recent pap smear in 2023 , pap smear collected today.. - Mammogram UTD, ordered screening today.  - Desires to start back on Slynd  after discontinuing in January. Rx provided. - STI screening accepted for vaginal swab, declined blood work.  - Follows with PCP for other health screening, has appointment in a few weeks.

## 2024-01-30 NOTE — Assessment & Plan Note (Signed)
-   Continues to note "thickening of breast tissue" from 12 to 3 o'clock on left breast. Unremarkable on physical exam. Previous history of abnormal mammogram of right breast requiring MRI and placemtn of markers. Per patient, breast biopsy was benign with recommendation for regular annual mammograms. Has mammogram ordered and will schedule.

## 2024-01-30 NOTE — Progress Notes (Signed)
 Outpatient Gynecology Note: Annual Visit  Assessment/Plan:    Jodi Salazar is a 43 y.o. female [phone] with normal well-woman gynecologic exam.   Well woman exam - Reviewed health maintenance topics as documented below. - Most recent pap smear in 2023 , pap smear collected today.. - Mammogram UTD, ordered screening today.  - Desires to start back on Slynd  after discontinuing in January. Rx provided. - STI screening accepted for vaginal swab, declined blood work.  - Follows with PCP for other health screening, has appointment in a few weeks.   Breast lump or mass - Continues to note "thickening of breast tissue" from 12 to 3 o'clock on left breast. Unremarkable on physical exam. Previous history of abnormal mammogram of right breast requiring MRI and placemtn of markers. Per patient, breast biopsy was benign with recommendation for regular annual mammograms. Has mammogram ordered and will schedule.       Orders Placed This Encounter  Procedures   MM 3D SCREENING MAMMOGRAM BILATERAL BREAST    Standing Status:   Future    Expected Date:   06/15/2024    Expiration Date:   01/29/2025    Reason for Exam (SYMPTOM  OR DIAGNOSIS REQUIRED):   screening    Is the patient pregnant?:   No    Preferred imaging location?:   [facility]   Ambulatory referral to Physical Therapy    Referral Priority:   Routine    Referral Type:   Physical Medicine    Referral Reason:   Specialty Services Required    Requested Specialty:   Physical Therapy    Number of Visits Requested:   1   Current Outpatient Medications  Medication Instructions   Blood Glucose Monitoring Suppl (ONE TOUCH ULTRA 2) w/Device KIT    Dulaglutide (TRULICITY) 0.75 MG/0.5ML SOPN Inject into the skin.   glucose blood (PRECISION QID TEST) test strip Check fasting blood sugar once daily. Use as instructed. ONE TOUCH DX E11.9   Lancets 30G MISC Use 1 each as directed (Dx E11.9) Check CBG's fasting once daily. Dx: E11.9    lovastatin (MEVACOR) 40 MG tablet No dose, route, or frequency recorded.   metFORMIN (GLUCOPHAGE) 500 MG tablet No dose, route, or frequency recorded.   Slynd  4 mg, Oral, Daily    No follow-ups on file.    Subjective:    Jodi Salazar is a 43 y.o. female [phone] who presents for annual wellness visit.   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 Hardness in left breast  Well Woman Visit:  GYN HISTORY:  No LMP recorded. (Menstrual status: Oral contraceptives).     Menstrual History: OB History     Gravida  5   Para  4   Term  4   Preterm      AB  1   Living  4      SAB  1   IAB      Ectopic      Multiple      Live Births  4             Periods are regular and last 4-6 days days. Dysmenorrhea: slightly heavier bleeding and cramping since stopping pill in January. Intermenstrual bleeding, spotting, or discharge? No Urinary incontinence? Yes, stress incontinence, desires physical therapy  Sexually active: yes Number of sexual partners: one Gender of sexual Partners: male Dyspareunia? no Last pap:2023  History of  abnormal Pap: yes, ASCUS in 2023 STI history: no STI/HIV testing or immunizations needed? Yes.    Contraceptive methods: no method, desires to start on Slynd   Health Maintenance > Reviewed breast self-awareness > History of abnormal mammogram: yes, in right breast  > Exercise: not currently plans to start again,  > Dietary Supplements: no > Body mass index is 33 kg/m.  > Recent dental visit Yes.   > Seat Belt Use: Yes.   > Texting and driving? No. > Safe in current relationship? Yes.   > Concern for alcohol abuse? None, drinks only socially   Tobacco or other drug use: denied. Tobacco Use: Low Risk  (01/30/2024)   Patient History    Smoking Tobacco Use: Never    Smokeless Tobacco Use: Never    Passive Exposure: Not on file     PHQ-2 Score: In last two weeks, how often have  you felt: Little interest or pleasure in doing things: Not at all (0) Feeling down, depressed or hopeless: Not at all (0) Score: 0  GAD-2 Over the last 2 weeks, how often have you been bothered by the following problems? Feeling nervous, anxious or on edge: Not at all (0) Not being able to stop or control worrying: Not at all (0)} Score: 0  If >40:  Last mammogram:  had to follow up for right breast  Last colonoscopy: follows PCP  Last lipid screening: follows with PCP   _________________________________________________________  Current Outpatient Medications  Medication Sig Dispense Refill   Blood Glucose Monitoring Suppl (ONE TOUCH ULTRA 2) w/Device KIT      Drospirenone  (SLYND ) 4 MG TABS Take 1 tablet (4 mg total) by mouth daily. 28 tablet 12   Dulaglutide (TRULICITY) 0.75 MG/0.5ML SOPN Inject into the skin.     glucose blood (PRECISION QID TEST) test strip Check fasting blood sugar once daily. Use as instructed. ONE TOUCH DX E11.9     Lancets 30G MISC Use 1 each as directed (Dx E11.9) Check CBG's fasting once daily. Dx: E11.9     lovastatin (MEVACOR) 40 MG tablet      metFORMIN (GLUCOPHAGE) 500 MG tablet      Current Facility-Administered Medications  Medication Dose Route Frequency Provider Last Rate Last Admin   metroNIDAZOLE  (METROGEL ) 0.75 % gel   Topical QHS Alicia Apa, CNM       No Known Allergies  Past Medical History:  Diagnosis Date   Diabetes mellitus without complication (HCC)    History of gestational diabetes    Hyperlipidemia    Past Surgical History:  Procedure Laterality Date   BREAST BIOPSY Right    TONSILLECTOMY     OB History     Gravida  5   Para  4   Term  4   Preterm      AB  1   Living  4      SAB  1   IAB      Ectopic      Multiple      Live Births  4          Social History   Tobacco Use   Smoking status: Never   Smokeless tobacco: Never  Substance Use Topics   Alcohol use: Yes    Alcohol/week: 0.0  standard drinks of alcohol    Comment: occasional   Social History   Substance and Sexual Activity  Sexual Activity Yes   Birth control/protection: Pill    Immunization History  Administered Date(s)  Administered   PFIZER Comirnaty(Gray Top)Covid-19 Tri-Sucrose Vaccine 01/07/2020, 01/28/2020, 10/23/2020     Review Of Systems  Constitutional: Denied constitutional symptoms, night sweats, recent illness, fatigue, fever, insomnia and weight loss.  Eyes: Denied eye symptoms, eye pain, photophobia, vision change and visual disturbance.  Ears/Nose/Throat/Neck: Denied ear, nose, throat or neck symptoms, hearing loss, nasal discharge, sinus congestion and sore throat.  Cardiovascular: Denied cardiovascular symptoms, arrhythmia, chest pain/pressure, edema, exercise intolerance, orthopnea and palpitations.  Respiratory: Denied pulmonary symptoms, asthma, pleuritic pain, productive sputum, cough, dyspnea and wheezing.  Gastrointestinal: Denied, gastro-esophageal reflux, melena, nausea and vomiting.  Genitourinary: Denied genitourinary symptoms including symptomatic vaginal discharge, pelvic relaxation issues, and urinary complaints.  Musculoskeletal: Denied musculoskeletal symptoms, stiffness, swelling, muscle weakness and myalgia.  Dermatologic: Denied dermatology symptoms, rash and scar.  Neurologic: Denied neurology symptoms, dizziness, headache, neck pain and syncope.  Psychiatric: Denied psychiatric symptoms, anxiety and depression.  Endocrine: Denied endocrine symptoms including hot flashes and night sweats.  Breast Reports thickening of left breast tissue on upper outer quadrant, has some pain before and after cycles.       Objective:    BP 135/89   Pulse 92   Ht 5\' 10"  (1.778 m)   Wt 230 lb (104.3 kg)   BMI 33.00 kg/m   Constitutional: Well-developed, well-nourished female in no acute distress Neurological: Alert and oriented to person, place, and time Psychiatric: Mood and  affect appropriate Skin: No rashes or lesions Neck: Supple without masses. Trachea is midline.Thyroid is normal size without masses Lymphatics: No cervical, axillary, supraclavicular, or inguinal adenopathy noted Respiratory: Clear to auscultation bilaterally. Good air movement with normal work of breathing. Cardiovascular: Regular rate and rhythm. Extremities grossly normal, nontender with no edema; pulses regular Gastrointestinal: Soft, mild tenderness over right adnexae, nondistended. No masses or hernias appreciated. No hepatosplenomegaly. No fluid wave. No rebound or guarding. Breast Exam: normal appearance, no masses or tenderness Genitourinary:         External Genitalia: Normal female genitalia    Vagina: well-rugated, no lesions.    Cervix: No lesions, normal size and consistency; no cervical motion tenderness     Adnexae: Non-palpable and non-tender Perineum/Anus: No lesions Rectal: deferred    Fred Jacobsen, CNM  01/30/24 10:06 AM

## 2024-02-04 LAB — CYTOLOGY - PAP
Chlamydia: NEGATIVE
Comment: NEGATIVE
Comment: NEGATIVE
Comment: NORMAL
Diagnosis: NEGATIVE
High risk HPV: NEGATIVE
Neisseria Gonorrhea: NEGATIVE

## 2024-03-06 ENCOUNTER — Other Ambulatory Visit: Payer: Self-pay

## 2024-03-06 DIAGNOSIS — B3731 Acute candidiasis of vulva and vagina: Secondary | ICD-10-CM

## 2024-03-06 MED ORDER — FLUCONAZOLE 150 MG PO TABS: 150.0000 mg | ORAL_TABLET | Freq: Once | ORAL | 0 refills | Status: AC

## 2024-04-03 ENCOUNTER — Ambulatory Visit
Admission: RE | Admit: 2024-04-03 | Discharge: 2024-04-03 | Disposition: A | Discharge status: Home / Self Care | Source: Ambulatory Visit

## 2024-04-03 DIAGNOSIS — Z1231 Encounter for screening mammogram for malignant neoplasm of breast: Secondary | ICD-10-CM | POA: Insufficient documentation

## 2024-04-03 DIAGNOSIS — Z01419 Encounter for gynecological examination (general) (routine) without abnormal findings: Secondary | ICD-10-CM | POA: Diagnosis present
